# Patient Record
Sex: Female | Born: 1990 | Race: White | Hispanic: No | Marital: Married | Smoking: Former smoker
Health system: Southern US, Community
[De-identification: ages and names within clinical notes are randomized; demographics above are authoritative.]

## PROBLEM LIST (undated history)

## (undated) ENCOUNTER — Inpatient Hospital Stay (HOSPITAL_COMMUNITY): Payer: Self-pay

## (undated) DIAGNOSIS — Z8489 Family history of other specified conditions: Secondary | ICD-10-CM

## (undated) DIAGNOSIS — N941 Unspecified dyspareunia: Secondary | ICD-10-CM

## (undated) DIAGNOSIS — Z973 Presence of spectacles and contact lenses: Secondary | ICD-10-CM

## (undated) DIAGNOSIS — F909 Attention-deficit hyperactivity disorder, unspecified type: Secondary | ICD-10-CM

## (undated) DIAGNOSIS — F419 Anxiety disorder, unspecified: Secondary | ICD-10-CM

## (undated) HISTORY — PX: INTRAUTERINE DEVICE INSERTION: SHX323

## (undated) HISTORY — DX: Attention-deficit hyperactivity disorder, unspecified type: F90.9

## (undated) HISTORY — PX: INGUINAL HERNIA REPAIR: SUR1180

## (undated) HISTORY — PX: OTHER SURGICAL HISTORY: SHX169

## (undated) HISTORY — PX: TONSILLECTOMY AND ADENOIDECTOMY: SUR1326

---
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
## 2011-05-01 LAB — OB RESULTS CONSOLE ANTIBODY SCREEN: Antibody Screen: NEGATIVE

## 2011-05-01 LAB — OB RESULTS CONSOLE HEPATITIS B SURFACE ANTIGEN: Hepatitis B Surface Ag: NEGATIVE

## 2011-05-27 LAB — OB RESULTS CONSOLE GC/CHLAMYDIA
Chlamydia: NEGATIVE
Gonorrhea: NEGATIVE

## 2011-07-23 ENCOUNTER — Encounter: Payer: Self-pay | Admitting: Family Medicine

## 2011-07-23 ENCOUNTER — Ambulatory Visit (INDEPENDENT_AMBULATORY_CARE_PROVIDER_SITE_OTHER): Payer: Self-pay | Admitting: Family Medicine

## 2011-07-23 VITALS — BP 97/67 | HR 66 | Temp 97.1°F | Ht 62.5 in | Wt 114.0 lb

## 2011-07-23 DIAGNOSIS — Z3490 Encounter for supervision of normal pregnancy, unspecified, unspecified trimester: Secondary | ICD-10-CM

## 2011-07-23 DIAGNOSIS — Z331 Pregnant state, incidental: Secondary | ICD-10-CM

## 2011-07-23 DIAGNOSIS — L738 Other specified follicular disorders: Secondary | ICD-10-CM

## 2011-07-23 DIAGNOSIS — L739 Follicular disorder, unspecified: Secondary | ICD-10-CM

## 2011-07-23 MED ORDER — MUPIROCIN 2 % EX OINT: TOPICAL_OINTMENT | CUTANEOUS | Status: DC

## 2011-07-23 MED ORDER — CEPHALEXIN 500 MG PO CAPS: 500.0000 mg | ORAL_CAPSULE | Freq: Four times a day (QID) | ORAL | Status: AC

## 2011-07-23 NOTE — Patient Instructions (Addendum)
Staphylococcal Infections  Staphylococcus aureus (Staph) is a germ that may cause infections, especially on broken skin or wounds. Methicillin is a drug sometimes used to treat Staph infections. If the germ is resistant to methicillin, it is called MRSA. Methicillin and some other drugs may not work to treat the infection. However, there are other antibiotic drugs that may be used that will treat the infection. Your caregiver may do a culture from your wound, skin, or other site. This will tell him/her that you have MRSA present. Sometimes healthy people carry MRSA, but it may also cause an infection.  Staph infections, including MRSA can spread from one person to another by contact with an infected person. You may prevent spreading an MRSA infection to those you live with or others around you by following these steps:  Keep infections and pus or drainage material covered with clean dry bandages. Follow your caregiver's instructions on proper care of the wound. Pus from infected wounds can contain MRSA and spread the germ (bacteria) to others.   Advise your family and other close contacts to wash their hands frequently with soap and warm water. This should be done especially if they change your bandages or touch the infected wound or infectious materials.   Avoid sharing personal items (towels, washcloth, razor, clothing, or uniforms) that may have had contact with the infected wound.   Wash linens and clothes that become soiled, with hot water and laundry detergent. Drying clothes in a hot dryer, rather than air-drying, also helps kill bacteria in clothes.   Tell any healthcare providers who treat you that you have an MRSA infection. In the hospital steps will be taken to prevent the spread of MRSA.   Ask your caregiver about return to school or return to work if you have a Staph or MRSA infection.   If your caregiver has given you a follow-up appointment, it is very important to keep that  appointment. Not keeping the appointment could result in a chronic or permanent injury, pain, and disability. If there is any problem keeping the appointment, you must call back to this facility for assistance.  Staph and MRSA infections can become very serious and you should contact your caregiver if your infection gets worse. To fight the infection, follow your doctor's instructions for wound care and take all medicines as prescribed. SEEK MEDICAL CARE IF:   You have increased pus coming from the wound.   You have a fever.   You notice a bad smell coming from the wound or dressing.  SEEK IMMEDIATE MEDICAL CARE IF:  You have redness, red streaks, swelling, or increasing pain in the wound. Document Released: 04/04/2002 Document Revised: 01/01/2011 Document Reviewed: 08/29/2007 [facility] Patient Information 2012 [place], [place].  Eat yogurt and take a probiotic (such as Align) daily for next month

## 2011-07-23 NOTE — Assessment & Plan Note (Addendum)
Keflex 500 mg po qid x 10 days, Bactroban to b/l nares bid x 7 days, hot compresses and then peroxide to lesion tid and probiotics daily. Wound culture taken

## 2011-07-23 NOTE — Assessment & Plan Note (Signed)
Due in November 2013

## 2011-07-23 NOTE — Progress Notes (Signed)
Patient ID: Tonya Kramer, female   DOB: 09/25/1990, 21 y.o.   MRN: [id number] Tonya Kramer [id number] 1990/10/27 07/23/2011      Progress Note-Follow Up  Subjective  Chief Complaint  Chief Complaint  Patient presents with  . spot on nose    X 3-4 days, neck hurts    HPI  Patient is a 21 year old Caucasian female in today for evaluation of roughly 4 days of a painful lesion in her nares. She says it came up suddenly he is on the right anterior nasal septum. It is tender to the touch. She's been trying to debridement and drainage and then cleaned it with peroxide but today it is increasingly uncomfortable. No fevers or chills. No increased malaise and myalgias. She does have some pain and swelling in her right neck as well for the last one to 2 days. No recent upper respiratory infection, cough, sore throat, ear pain, headache, chest pain, palpitations or other concerns. She is pregnant and due this November.  Past Medical History  Diagnosis Date  . Hidradenitis 09/24/2010  . Tobacco abuse 09/24/2010  . ADHD (attention deficit hyperactivity disorder) 09/24/2010  . Folliculitis 09/24/2010    Past Surgical History  Procedure Date  . Double hernia  21 yrs old    operation  . Tonsillectomy 14    Family History  Problem Relation Age of Onset  . Hyperlipidemia Mother   . Fibromyalgia Mother   . Irritable bowel syndrome Mother   . Cancer Mother     skin cancer on face  . Arthritis Mother   . Tuberculosis Mother     carrier  . Hypertension Father   . Asthma Sister   . Cancer Brother     cancer on rib  . Other Brother     heart beat slow  . Arthritis Maternal Grandmother     hands and feet  . Hypertension Maternal Grandfather   . Heart disease Maternal Grandfather   . Cancer Maternal Grandfather     prostate  . COPD Maternal Grandfather     smoker  . Emphysema Maternal Grandfather     smoker  . Migraines Paternal Grandmother   . Depression Paternal Grandmother   .  Other Paternal Grandmother     hip replacement  . Arthritis Paternal Grandfather   . Drug abuse Paternal Grandfather   . Asthma Sister     History   Social History  . Marital Status: Single    Spouse Name: N/A    Number of Children: N/A  . Years of Education: N/A   Occupational History  . Not on file.   Social History Main Topics  . Smoking status: Former Smoker -- 1.0 packs/day for 3 years    Types: Cigarettes  . Smokeless tobacco: Never Used  . Alcohol Use: No  . Drug Use: No  . Sexually Active: Not Currently   Other Topics Concern  . Not on file   Social History Narrative  . No narrative on file    Current Outpatient Prescriptions on File Prior to Visit  Medication Sig Dispense Refill  . mupirocin (BACTROBAN) 2 % ointment Apply to affected area 2 times daily  22 g  1    No Known Allergies  Review of Systems  Review of Systems  Constitutional: Negative for fever, chills and malaise/fatigue.  HENT: Negative for nosebleeds and congestion.        Painful, red, swollen lesion at anterior right nares, sore neck and enlarged LN  on right as well  Eyes: Negative for discharge.  Respiratory: Negative for shortness of breath.   Cardiovascular: Negative for chest pain, palpitations and leg swelling.  Gastrointestinal: Negative for nausea, abdominal pain and diarrhea.  Genitourinary: Negative for dysuria.  Musculoskeletal: Negative for falls.  Skin: Negative for rash.  Neurological: Negative for loss of consciousness and headaches.  Endo/Heme/Allergies: Negative for polydipsia.  Psychiatric/Behavioral: Negative for depression and suicidal ideas. The patient is not nervous/anxious and does not have insomnia.     Objective  BP 97/67  Pulse 66  Temp 97.1 F (36.2 C) (Temporal)  Ht 5' 2.5" (1.588 m)  Wt 114 lb (51.71 kg)  BMI 20.52 kg/m2  SpO2 100%  LMP 09/22/2010  Physical Exam  Physical Exam  Constitutional: She is oriented to person, place, and time and  well-developed, well-nourished, and in no distress. No distress.  HENT:  Head: Normocephalic and atraumatic.       Right anterior nasal septum with red, swollen painful lesion  Eyes: Conjunctivae are normal.  Neck: Neck supple. No thyromegaly present.       Right anterior cervical LN enlarged and tender  Cardiovascular: Normal rate, regular rhythm and normal heart sounds.   No murmur heard. Pulmonary/Chest: Effort normal and breath sounds normal. She has no wheezes.  Abdominal: She exhibits no distension and no mass.  Musculoskeletal: She exhibits no edema.  Lymphadenopathy:    She has cervical adenopathy.  Neurological: She is alert and oriented to person, place, and time.  Skin: Skin is warm and dry. No rash noted. She is not diaphoretic.  Psychiatric: Memory, affect and judgment normal.    No results found for this basename: TSH   Lab Results  Component Value Date   WBC 22.3* 07/11/2009   HGB 10.6* 07/11/2009   HCT 30.7* 07/11/2009   MCV 93.1 07/11/2009   PLT 194 07/11/2009     Assessment & Plan  Folliculitis Keflex 500 mg po qid x 10 days, Bactroban to b/l nares bid x 7 days, hot compresses and then peroxide to lesion tid and probiotics daily. Wound culture taken  Pregnancy Due in November 2013

## 2011-07-25 LAB — WOUND CULTURE
Gram Stain: NONE SEEN
Gram Stain: NONE SEEN
Gram Stain: NONE SEEN

## 2011-10-04 ENCOUNTER — Emergency Department (HOSPITAL_BASED_OUTPATIENT_CLINIC_OR_DEPARTMENT_OTHER): Payer: Medicaid Other

## 2011-10-04 ENCOUNTER — Inpatient Hospital Stay (HOSPITAL_COMMUNITY)
Admission: AD | Admit: 2011-10-04 | Discharge: 2011-10-05 | Disposition: A | Discharge status: Home / Self Care | Payer: Medicaid Other | Source: Ambulatory Visit | Attending: Obstetrics and Gynecology | Admitting: Obstetrics and Gynecology

## 2011-10-04 ENCOUNTER — Encounter (HOSPITAL_BASED_OUTPATIENT_CLINIC_OR_DEPARTMENT_OTHER): Payer: Self-pay | Admitting: Emergency Medicine

## 2011-10-04 ENCOUNTER — Encounter (HOSPITAL_COMMUNITY): Payer: Self-pay | Admitting: *Deleted

## 2011-10-04 ENCOUNTER — Emergency Department (HOSPITAL_BASED_OUTPATIENT_CLINIC_OR_DEPARTMENT_OTHER)
Admission: EM | Admit: 2011-10-04 | Discharge: 2011-10-04 | Disposition: A | Discharge status: Home / Self Care | Payer: Medicaid Other | Source: Home / Self Care | Attending: Emergency Medicine | Admitting: Emergency Medicine

## 2011-10-04 DIAGNOSIS — Z3490 Encounter for supervision of normal pregnancy, unspecified, unspecified trimester: Secondary | ICD-10-CM

## 2011-10-04 DIAGNOSIS — O99891 Other specified diseases and conditions complicating pregnancy: Secondary | ICD-10-CM | POA: Insufficient documentation

## 2011-10-04 DIAGNOSIS — R0789 Other chest pain: Secondary | ICD-10-CM

## 2011-10-04 DIAGNOSIS — R071 Chest pain on breathing: Secondary | ICD-10-CM | POA: Insufficient documentation

## 2011-10-04 DIAGNOSIS — R0602 Shortness of breath: Secondary | ICD-10-CM | POA: Insufficient documentation

## 2011-10-04 LAB — CBC WITH DIFFERENTIAL/PLATELET
Basophils Absolute: 0 10*3/uL (ref 0.0–0.1)
Basophils Relative: 0 % (ref 0–1)
Eosinophils Absolute: 0 10*3/uL (ref 0.0–0.7)
Eosinophils Relative: 0 % (ref 0–5)
Hemoglobin: 11.4 g/dL — ABNORMAL LOW (ref 12.0–15.0)
Lymphocytes Relative: 10 % — ABNORMAL LOW (ref 12–46)
MCV: 90.4 fL (ref 78.0–100.0)
Monocytes Absolute: 0.6 10*3/uL (ref 0.1–1.0)
Neutro Abs: 6.9 10*3/uL (ref 1.7–7.7)
Neutrophils Relative %: 82 % — ABNORMAL HIGH (ref 43–77)
Platelets: 145 10*3/uL — ABNORMAL LOW (ref 150–400)
RDW: 11.9 % (ref 11.5–15.5)
WBC: 8.4 10*3/uL (ref 4.0–10.5)

## 2011-10-04 LAB — BASIC METABOLIC PANEL
BUN: 4 mg/dL — ABNORMAL LOW (ref 6–23)
CO2: 23 mEq/L (ref 19–32)
Calcium: 8.6 mg/dL (ref 8.4–10.5)
Chloride: 101 mEq/L (ref 96–112)
Glucose, Bld: 89 mg/dL (ref 70–99)
Potassium: 3.2 mEq/L — ABNORMAL LOW (ref 3.5–5.1)
Sodium: 136 mEq/L (ref 135–145)

## 2011-10-04 MED ORDER — FAMOTIDINE 20 MG PO TABS
20.0000 mg | ORAL_TABLET | Freq: Once | ORAL | Status: AC
  Administered 2011-10-04: 20 mg via ORAL
  Filled 2011-10-04: qty 1

## 2011-10-04 MED ORDER — POTASSIUM CHLORIDE CRYS ER 20 MEQ PO TBCR
40.0000 meq | EXTENDED_RELEASE_TABLET | Freq: Once | ORAL | Status: AC
  Administered 2011-10-04: 40 meq via ORAL
  Filled 2011-10-04: qty 2

## 2011-10-04 MED ORDER — CYCLOBENZAPRINE HCL 10 MG PO TABS
10.0000 mg | ORAL_TABLET | Freq: Once | ORAL | Status: AC
  Administered 2011-10-04: 10 mg via ORAL
  Filled 2011-10-04: qty 1

## 2011-10-04 MED ORDER — IOHEXOL 350 MG/ML SOLN
100.0000 mL | Freq: Once | INTRAVENOUS | Status: AC | PRN
  Administered 2011-10-04: 100 mL via INTRAVENOUS

## 2011-10-04 MED ORDER — SODIUM CHLORIDE 0.9 % IV BOLUS (SEPSIS)
1000.0000 mL | Freq: Once | INTRAVENOUS | Status: AC
  Administered 2011-10-04: 1000 mL via INTRAVENOUS

## 2011-10-04 MED ORDER — CYCLOBENZAPRINE HCL 10 MG PO TABS
10.0000 mg | ORAL_TABLET | Freq: Once | ORAL | Status: DC
  Filled 2011-10-04: qty 1

## 2011-10-04 MED ORDER — ACETAMINOPHEN 325 MG PO TABS
650.0000 mg | ORAL_TABLET | Freq: Once | ORAL | Status: AC
  Administered 2011-10-04: 650 mg via ORAL
  Filled 2011-10-04: qty 2

## 2011-10-04 MED ORDER — OXYCODONE-ACETAMINOPHEN 5-325 MG PO TABS
1.0000 | ORAL_TABLET | Freq: Once | ORAL | Status: AC
  Administered 2011-10-04: 1 via ORAL
  Filled 2011-10-04: qty 1

## 2011-10-04 NOTE — ED Notes (Signed)
History of blood clots: maternal grandfather had blood clots and paternal great grand mother had blood clots.

## 2011-10-04 NOTE — MAU Provider Note (Signed)
History     CSN: [id number]  Arrival date and time: 10/04/11 2043   None     Chief Complaint  Patient presents with  . Shortness of Breath   HPI Tonya Kramer is a 21 y.o. female @ [redacted]w[redacted]d gestation who presents to MAU with chest pain. She was evaluated at [facility] earlier today for same and had CT of chest, CXR and labs. Her K was low and she received IV fluids po potassium 40 meq. Patient report that the symptoms started 2 days ago. She has a cough in the mornings but usually goes away. The pain is located in the left chest wall with radiation to the left upper back. She rates the pain as 10/10. She describes the pain as sharp that is constant. The pan is worse with lying flat, take a deep breath and movement. Nothing makes the pain better. [facility] relief. The history was provided by the patient and her medical record.   OB History    Grav Para Term Preterm Abortions TAB SAB Ect Mult Living   2 1 1       1       Past Medical History  Diagnosis Date  . Hidradenitis 09/24/2010  . Tobacco abuse 09/24/2010  . ADHD (attention deficit hyperactivity disorder) 09/24/2010  . Folliculitis 09/24/2010  . Pregnancy 07/23/2011    Past Surgical History  Procedure Date  . Double hernia  21 yrs old    operation  . Tonsillectomy 14    Family History  Problem Relation Age of Onset  . Hyperlipidemia Mother   . Fibromyalgia Mother   . Irritable bowel syndrome Mother   . Cancer Mother     skin cancer on face  . Arthritis Mother   . Tuberculosis Mother     carrier  . Hypertension Father   . Asthma Sister   . Cancer Brother     cancer on rib  . Other Brother     heart beat slow  . Arthritis Maternal Grandmother     hands and feet  . Hypertension Maternal Grandfather   . Heart disease Maternal Grandfather   . Cancer Maternal Grandfather     prostate  . COPD Maternal Grandfather     smoker  . Emphysema Maternal Grandfather     smoker  .  Migraines Paternal Grandmother   . Depression Paternal Grandmother   . Other Paternal Grandmother     hip replacement  . Arthritis Paternal Grandfather   . Drug abuse Paternal Grandfather   . Asthma Sister     History  Substance Use Topics  . Smoking status: Former Smoker -- 1.0 packs/day for 3 years    Types: Cigarettes  . Smokeless tobacco: Never Used  . Alcohol Use: No    Allergies: No Known Allergies  Prescriptions prior to admission  Medication Sig Dispense Refill  . Prenatal Vit-Fe Fumarate-FA (PRENATAL MULTIVITAMIN) TABS Take 1 tablet by mouth every morning.         Review of Systems  Constitutional: Negative for fever, chills and weight loss.  HENT: Positive for sore throat. Negative for ear pain, nosebleeds, congestion and neck pain.   Eyes: Negative for blurred vision, double vision, photophobia and pain.  Respiratory: Positive for cough and shortness of breath. Negative for wheezing.   Cardiovascular: Positive for chest pain. Negative for palpitations and leg swelling.  Gastrointestinal: Positive for heartburn and nausea. Negative for vomiting, abdominal pain (  soreness upper abdomen), diarrhea and constipation.  Genitourinary: Negative for dysuria, urgency and frequency.  Musculoskeletal: Positive for back pain. Negative for myalgias.  Skin: Negative for itching and rash.  Neurological: Negative for dizziness, sensory change, speech change, seizures, weakness and headaches.  Endo/Heme/Allergies: Does not bruise/bleed easily.  Psychiatric/Behavioral: Negative for depression. The patient is not nervous/anxious and does not have insomnia.    Blood pressure 116/65, pulse 110, temperature 99 F (37.2 C), temperature source Oral, resp. rate 20, height 5\' 2"  (1.575 m), weight 124 lb (56.246 kg), last menstrual period 09/22/2010, SpO2 100.00%.  Physical Exam  Nursing note and vitals reviewed. Constitutional: She is oriented to person, place, and time. She appears  well-developed and well-nourished. No distress.  HENT:  Head: Normocephalic and atraumatic.  Eyes: EOM are normal.  Neck: Neck supple.  Cardiovascular:       Tachycardia   Respiratory: Effort normal. No respiratory distress. She has no wheezes. She has no rales. She exhibits tenderness.    GI: Soft. There is tenderness in the epigastric area. There is no rebound, no guarding and no CVA tenderness.  Genitourinary: Vagina normal.  Musculoskeletal: Normal range of motion.  Neurological: She is alert and oriented to person, place, and time.  Skin: Skin is warm and dry.  Psychiatric: She has a normal mood and affect. Her behavior is normal. Judgment and thought content normal.   Findings from visit to [facility] earlier today.  Dg Chest 2 View  10/04/2011  *RADIOLOGY REPORT*  Clinical Data: Shortness of breath  CHEST - 2 VIEW  Comparison: None.  Findings: The lungs are well-aerated and free from pulmonary edema, focal airspace consolidation or pulmonary nodule.  Cardiac and mediastinal contours are within normal limits.  No pneumothorax, or pleural effusion. No acute osseous findings.  IMPRESSION:  No acute cardiopulmonary disease.  Normal chest radiograph.   Original Report Authenticated By: Vilma Prader    Ct Angio Chest Pe W/cm &/or Wo Cm  10/04/2011  *RADIOLOGY REPORT*  Clinical Data: Left lower chest wall pain, worse with inspiration. The patient is pregnant, and abdomen was shielded.  CT ANGIOGRAPHY CHEST  Technique:  Multidetector CT imaging of the chest using the standard protocol during bolus administration of intravenous contrast. Multiplanar reconstructed images including MIPs were obtained and reviewed to evaluate the vascular anatomy.  Contrast: OMNIPAQUE IOHEXOL 350 MG/ML SOLN  Comparison: Chest radiograph 10/04/2011  Findings: This is a technically very good evaluation of the pulmonary arteries.  No focal filling defects are identified in the pulmonary arteries to suggest  pulmonary embolism. Normal heart size. Normal caliber and enhancement of the thoracic aorta. Negative for pleural or pericardial effusion.  Negative for lymphadenopathy in the chest.  The distal esophagus is slightly dilated with air and there is a small air-fluid level.  The lungs are well expanded and clear.  The very superior aspect of the lung apices are not included on this study.  No acute or suspicious bony abnormality.  IMPRESSION:  1.  Negative for pulmonary embolism.  This is a technically very good examination. 2.  The lungs are clear.  The extreme lung apices are excluded from the imaging field, better well visualized on the concurrent chest radiograph. 3.  Slight gaseous distention of the distal esophagus, containing an air-fluid level.  This can be seen in the setting of gastroesophageal reflux.   Original Report Authenticated By: Britta Mccreedy, M.D.      MAU Course: discussed with Dr. Rana Snare, will treat  pain and discharge patient home. If no pain relief will transfer to [facility].   Procedures Assessment: 21 y.o. female @ [redacted]w[redacted]d gestation with chest wall pain/muscle strain  Plan:  Percocet 5/325mg     Flexeril 10 mg po   GI Cocktail 30 ml.  @ 00:37 am pain has decreased from 10/10 to 3/10. Patient wants to go home and follow up in the office later today. If her symptoms return she is to go to [facility] for further evaluation. Medication List  As of 10/05/2011  3:47 AM   START taking these medications         oxyCODONE-acetaminophen 5-325 MG per tablet   Commonly known as: PERCOCET/ROXICET   Take 1 tablet by mouth every 4 (four) hours as needed for pain.         CONTINUE taking these medications         prenatal multivitamin Tabs          Where to get your medications    These are the prescriptions that you need to pick up.   You may get these medications from any pharmacy.         oxyCODONE-acetaminophen 5-325 MG per tablet          No Follow-up on file.  NEESE,HOPE, RN, FNP,  [facility] 10/05/2011, 12:32 AM

## 2011-10-04 NOTE — MAU Note (Signed)
Shortness of breath. Sharp pain during inhalation. Patient states the left of side under breast bone feels sore like someone has beaten on her. Went to [facility] today for the same thing. CT and chest xray were done. They said no blood clot and could be a pulled muscle. However it has gotten worst. Unable to lay flat.

## 2011-10-04 NOTE — ED Notes (Signed)
Shortness of breath x 2 days.  Some pain with deep breath on left.  No recent travel.  Some non-productive cough in morning with sore throat.  No respiratory distress noted.

## 2011-10-05 MED ORDER — OXYCODONE-ACETAMINOPHEN 5-325 MG PO TABS: 1.0000 | ORAL_TABLET | ORAL | Status: AC | PRN

## 2011-10-05 MED ORDER — GI COCKTAIL ~~LOC~~
30.0000 mL | Freq: Once | ORAL | Status: AC
  Administered 2011-10-05: 30 mL via ORAL
  Filled 2011-10-05: qty 30

## 2011-10-10 NOTE — ED Provider Notes (Signed)
History     CSN: [id number]  Arrival date & time 10/04/11  1308   First MD Initiated Contact with Patient 10/04/11 [phone]      Chief Complaint  Patient presents with  . Shortness of Breath    (Consider location/radiation/quality/duration/timing/severity/associated sxs/prior treatment) HPI Patient is a 21 yo G2P1 @ 31 weeks who presents today complaining of 2 days of severe left lower chest wall pain worse with taking a deep breath as well as shortness of breath.  She has taken no meds for this and denies cough or sick contacts.  She denies injury, abdominal pain, nausea, or vomiting.  This is different from prior pregnancies.  Patient denies personal history of thromboembolic disease but does have family history of thromboembolic disease. She denies recent long trips.  She is not tachycardic or tachypneic.  She denies shortness of breath.  She denies dyspnea on exertion. She denies vaginal bleeding, vaginal discharge, abdominal pain, nausea, vomiting, or change in baby's movements. There are no other associated or modifying factors.  Past Medical History  Diagnosis Date  . Hidradenitis 09/24/2010  . Tobacco abuse 09/24/2010  . ADHD (attention deficit hyperactivity disorder) 09/24/2010  . Folliculitis 09/24/2010  . Pregnancy 07/23/2011    Past Surgical History  Procedure Date  . Double hernia  21 yrs old    operation  . Tonsillectomy 14    Family History  Problem Relation Age of Onset  . Hyperlipidemia Mother   . Fibromyalgia Mother   . Irritable bowel syndrome Mother   . Cancer Mother     skin cancer on face  . Arthritis Mother   . Tuberculosis Mother     carrier  . Hypertension Father   . Asthma Sister   . Cancer Brother     cancer on rib  . Other Brother     heart beat slow  . Arthritis Maternal Grandmother     hands and feet  . Hypertension Maternal Grandfather   . Heart disease Maternal Grandfather   . Cancer Maternal Grandfather     prostate  . COPD Maternal  Grandfather     smoker  . Emphysema Maternal Grandfather     smoker  . Migraines Paternal Grandmother   . Depression Paternal Grandmother   . Other Paternal Grandmother     hip replacement  . Arthritis Paternal Grandfather   . Drug abuse Paternal Grandfather   . Asthma Sister     History  Substance Use Topics  . Smoking status: Former Smoker -- 1.0 packs/day for 3 years    Types: Cigarettes  . Smokeless tobacco: Never Used  . Alcohol Use: No    OB History    Grav Para Term Preterm Abortions TAB SAB Ect Mult Living   2 1 1       1       Review of Systems  Constitutional: Negative.   HENT: Negative.   Eyes: Negative.   Respiratory: Negative.   Cardiovascular: Positive for chest pain.  Gastrointestinal: Negative.   Genitourinary: Negative.   Musculoskeletal: Negative.   Skin: Negative.   Neurological: Negative.   Hematological: Negative.   Psychiatric/Behavioral: Negative.   All other systems reviewed and are negative.    Allergies  Review of patient's allergies indicates no known allergies.  Home Medications   Current Outpatient Rx  Name Route Sig Dispense Refill  . OXYCODONE-ACETAMINOPHEN 5-325 MG PO TABS Oral Take 1 tablet by mouth every 4 (four) hours as needed for pain. 6 tablet 0  .  PRENATAL MULTIVITAMIN CH Oral Take 1 tablet by mouth every morning.       BP 104/60  Pulse 97  Temp 97.7 F (36.5 C) (Oral)  Resp 18  SpO2 100%  LMP 09/22/2010  Physical Exam  Nursing note and vitals reviewed. GEN: Well-developed, well-nourished female in no distress HEENT: Atraumatic, normocephalic. Oropharynx clear without erythema EYES: PERRLA BL, no scleral icterus. NECK: Trachea midline, no meningismus CV: regular rate and rhythm. No murmurs, rubs, or gallops PULM: No respiratory distress.  No crackles, wheezes, or rales. GI: gravid abdomen c/w dates. No guarding, rebound, or tenderness. + bowel sounds  GU: deferred, FHT 144 Neuro: cranial nerves grossly 2-12  intact, no abnormalities of strength or sensation, A and O x 3 MSK: Patient moves all 4 extremities symmetrically, no deformity, edema, or injury noted Skin: No rashes petechiae, purpura, or jaundice Psych: no abnormality of mood   ED Course  Procedures (including critical care time)  Labs Reviewed  CBC WITH DIFFERENTIAL - Abnormal; Notable for the following:    RBC 3.54 (*)     Hemoglobin 11.4 (*)     HCT 32.0 (*)     Platelets 145 (*)     Neutrophils Relative 82 (*)     Lymphocytes Relative 10 (*)     All other components within normal limits  BASIC METABOLIC PANEL - Abnormal; Notable for the following:    Potassium 3.2 (*)     BUN 4 (*)     All other components within normal limits   No results found.   1. Left-sided chest wall pain       MDM  Patient presented with pain over the left lowermost ribs with shortness of breath and worsening with inspiration.  Patient denied cough or fevers to suggest PNA or other infectious process.  Patient had CBC with anemia consistent with gestational status, renal with slight hypokalemia, and CXR with no pulmonary process seen and no cardiomegaly.  Patient was treated with tylenol with no improvement.  We discussed the risks and benefits of radiation in pregnancy and together decided that further imaging to eval for PE given her presentation was necessary.  I discussed the case with radiologist , Dr. Earley Abide who felt CTPA was still the best diagnostic test based on risk/benefit ratio in pregnancy.  Patient received IVF and had CT angio of chest given unreliability of d-dimer in pregnancy.  This showed no sign of embolus.  Patient had no belly TTP and she remained HDS.  No definite cause was found but patient was feeling better and did not have signs of PNA, heart failure, PE, or other emergent causes of CP.  Patient was d/c'ed with instructions to continue tylenol and follow-up with her OBGYN.         Cyndra Numbers, MD 10/10/11 1214

## 2011-12-01 ENCOUNTER — Encounter (HOSPITAL_COMMUNITY): Payer: Self-pay | Admitting: *Deleted

## 2011-12-01 ENCOUNTER — Inpatient Hospital Stay (HOSPITAL_COMMUNITY)
Admission: AD | Admit: 2011-12-01 | Discharge: 2011-12-01 | Disposition: A | Discharge status: Home / Self Care | Payer: Medicaid Other | Source: Ambulatory Visit | Attending: Obstetrics and Gynecology | Admitting: Obstetrics and Gynecology

## 2011-12-01 DIAGNOSIS — O479 False labor, unspecified: Secondary | ICD-10-CM | POA: Insufficient documentation

## 2011-12-01 NOTE — MAU Note (Signed)
Pt up to walk.

## 2011-12-01 NOTE — MAU Note (Signed)
Dr. Vincente Poli notified of SVE after wallking.  Pt may walk another hour and recheck or D/C home and follow up in the office later today.

## 2011-12-01 NOTE — MAU Note (Signed)
Plan of care discussed with pt.  Pt desires to go home and follow up in the office later today.

## 2011-12-01 NOTE — MAU Note (Signed)
Dr. Adalberto Ill notified of pt.  Pt to walk x 1 hr and recheck cervix.

## 2011-12-01 NOTE — MAU Note (Signed)
Contractions, 4 cm at office yesterday

## 2011-12-02 ENCOUNTER — Encounter (HOSPITAL_COMMUNITY): Payer: Self-pay | Admitting: *Deleted

## 2011-12-02 ENCOUNTER — Inpatient Hospital Stay (HOSPITAL_COMMUNITY): Payer: Medicaid Other | Admitting: Anesthesiology

## 2011-12-02 ENCOUNTER — Inpatient Hospital Stay (HOSPITAL_COMMUNITY)
Admission: AD | Admit: 2011-12-02 | Discharge: 2011-12-04 | DRG: 775 | Disposition: A | Discharge status: Home / Self Care | Payer: Medicaid Other | Source: Ambulatory Visit | Attending: Obstetrics and Gynecology | Admitting: Obstetrics and Gynecology

## 2011-12-02 ENCOUNTER — Encounter (HOSPITAL_COMMUNITY): Payer: Self-pay | Admitting: Anesthesiology

## 2011-12-02 DIAGNOSIS — Z3490 Encounter for supervision of normal pregnancy, unspecified, unspecified trimester: Secondary | ICD-10-CM

## 2011-12-02 DIAGNOSIS — Z720 Tobacco use: Secondary | ICD-10-CM

## 2011-12-02 DIAGNOSIS — L739 Follicular disorder, unspecified: Secondary | ICD-10-CM

## 2011-12-02 DIAGNOSIS — F909 Attention-deficit hyperactivity disorder, unspecified type: Secondary | ICD-10-CM

## 2011-12-02 LAB — CBC
Hemoglobin: 11.3 g/dL — ABNORMAL LOW (ref 12.0–15.0)
RBC: 3.78 MIL/uL — ABNORMAL LOW (ref 3.87–5.11)

## 2011-12-02 LAB — POCT FERN TEST: POCT Fern Test: POSITIVE

## 2011-12-02 LAB — RPR: RPR Ser Ql: NONREACTIVE

## 2011-12-02 MED ORDER — FENTANYL 2.5 MCG/ML BUPIVACAINE 1/10 % EPIDURAL INFUSION (WH - ANES)
14.0000 mL/h | INTRAMUSCULAR | Status: DC
  Filled 2011-12-02: qty 125

## 2011-12-02 MED ORDER — BUTORPHANOL TARTRATE 1 MG/ML IJ SOLN
1.0000 mg | INTRAMUSCULAR | Status: DC | PRN
  Administered 2011-12-02: 1 mg via INTRAVENOUS
  Filled 2011-12-02: qty 1

## 2011-12-02 MED ORDER — OXYCODONE-ACETAMINOPHEN 5-325 MG PO TABS
1.0000 | ORAL_TABLET | ORAL | Status: DC | PRN
Start: 2011-12-02 — End: 2011-12-04
  Administered 2011-12-02 – 2011-12-04 (×8): 1 via ORAL
  Filled 2011-12-02 (×8): qty 1

## 2011-12-02 MED ORDER — LACTATED RINGERS IV SOLN: 500.0000 mL | INTRAVENOUS | Status: DC | PRN

## 2011-12-02 MED ORDER — ACETAMINOPHEN 325 MG PO TABS: 650.0000 mg | ORAL_TABLET | ORAL | Status: DC | PRN

## 2011-12-02 MED ORDER — ONDANSETRON HCL 4 MG/2ML IJ SOLN
4.0000 mg | Freq: Four times a day (QID) | INTRAMUSCULAR | Status: DC | PRN
Start: 2011-12-02 — End: 2011-12-02

## 2011-12-02 MED ORDER — PHENYLEPHRINE 40 MCG/ML (10ML) SYRINGE FOR IV PUSH (FOR BLOOD PRESSURE SUPPORT)
80.0000 ug | PREFILLED_SYRINGE | INTRAVENOUS | Status: DC | PRN
  Filled 2011-12-02: qty 5

## 2011-12-02 MED ORDER — PRENATAL MULTIVITAMIN CH
1.0000 | ORAL_TABLET | Freq: Every day | ORAL | Status: DC
  Administered 2011-12-02 – 2011-12-04 (×3): 1 via ORAL
  Filled 2011-12-02 (×3): qty 1

## 2011-12-02 MED ORDER — DIPHENHYDRAMINE HCL 50 MG/ML IJ SOLN: 12.5000 mg | INTRAMUSCULAR | Status: DC | PRN

## 2011-12-02 MED ORDER — TETANUS-DIPHTH-ACELL PERTUSSIS 5-2.5-18.5 LF-MCG/0.5 IM SUSP: 0.5000 mL | Freq: Once | INTRAMUSCULAR | Status: DC

## 2011-12-02 MED ORDER — ONDANSETRON HCL 4 MG/2ML IJ SOLN: 4.0000 mg | INTRAMUSCULAR | Status: DC | PRN

## 2011-12-02 MED ORDER — OXYCODONE-ACETAMINOPHEN 5-325 MG PO TABS: 1.0000 | ORAL_TABLET | ORAL | Status: DC | PRN

## 2011-12-02 MED ORDER — SENNOSIDES-DOCUSATE SODIUM 8.6-50 MG PO TABS
2.0000 | ORAL_TABLET | Freq: Every day | ORAL | Status: DC
  Administered 2011-12-02 – 2011-12-03 (×2): 2 via ORAL

## 2011-12-02 MED ORDER — ONDANSETRON HCL 4 MG PO TABS: 4.0000 mg | ORAL_TABLET | ORAL | Status: DC | PRN

## 2011-12-02 MED ORDER — LIDOCAINE HCL (PF) 1 % IJ SOLN
INTRAMUSCULAR | Status: DC | PRN
  Administered 2011-12-02 (×2): 4 mL

## 2011-12-02 MED ORDER — OXYTOCIN 40 UNITS IN LACTATED RINGERS INFUSION - SIMPLE MED
62.5000 mL/h | INTRAVENOUS | Status: DC
  Administered 2011-12-02: 62.5 mL/h via INTRAVENOUS
  Filled 2011-12-02: qty 1000

## 2011-12-02 MED ORDER — MEDROXYPROGESTERONE ACETATE 150 MG/ML IM SUSP: 150.0000 mg | INTRAMUSCULAR | Status: DC | PRN

## 2011-12-02 MED ORDER — DIBUCAINE 1 % RE OINT: 1.0000 "application " | TOPICAL_OINTMENT | RECTAL | Status: DC | PRN

## 2011-12-02 MED ORDER — DIPHENHYDRAMINE HCL 25 MG PO CAPS
25.0000 mg | ORAL_CAPSULE | Freq: Four times a day (QID) | ORAL | Status: DC | PRN
  Administered 2011-12-02: 25 mg via ORAL
  Filled 2011-12-02: qty 1

## 2011-12-02 MED ORDER — LANOLIN HYDROUS EX OINT: TOPICAL_OINTMENT | CUTANEOUS | Status: DC | PRN

## 2011-12-02 MED ORDER — WITCH HAZEL-GLYCERIN EX PADS: 1.0000 "application " | MEDICATED_PAD | CUTANEOUS | Status: DC | PRN

## 2011-12-02 MED ORDER — LACTATED RINGERS IV SOLN: 500.0000 mL | Freq: Once | INTRAVENOUS | Status: DC

## 2011-12-02 MED ORDER — SIMETHICONE 80 MG PO CHEW: 80.0000 mg | CHEWABLE_TABLET | ORAL | Status: DC | PRN

## 2011-12-02 MED ORDER — LIDOCAINE HCL (PF) 1 % IJ SOLN
30.0000 mL | INTRAMUSCULAR | Status: DC | PRN
  Filled 2011-12-02: qty 30

## 2011-12-02 MED ORDER — CITRIC ACID-SODIUM CITRATE 334-500 MG/5ML PO SOLN: 30.0000 mL | ORAL | Status: DC | PRN

## 2011-12-02 MED ORDER — IBUPROFEN 600 MG PO TABS
600.0000 mg | ORAL_TABLET | Freq: Four times a day (QID) | ORAL | Status: DC
  Administered 2011-12-02 – 2011-12-04 (×8): 600 mg via ORAL
  Filled 2011-12-02 (×8): qty 1

## 2011-12-02 MED ORDER — EPHEDRINE 5 MG/ML INJ: 10.0000 mg | INTRAVENOUS | Status: DC | PRN

## 2011-12-02 MED ORDER — PHENYLEPHRINE 40 MCG/ML (10ML) SYRINGE FOR IV PUSH (FOR BLOOD PRESSURE SUPPORT): 80.0000 ug | PREFILLED_SYRINGE | INTRAVENOUS | Status: DC | PRN

## 2011-12-02 MED ORDER — PROMETHAZINE HCL 25 MG/ML IJ SOLN
12.5000 mg | Freq: Four times a day (QID) | INTRAMUSCULAR | Status: DC | PRN
  Administered 2011-12-02: 12.5 mg via INTRAVENOUS
  Filled 2011-12-02: qty 1

## 2011-12-02 MED ORDER — LACTATED RINGERS IV SOLN
INTRAVENOUS | Status: DC
  Administered 2011-12-02 (×2): via INTRAVENOUS

## 2011-12-02 MED ORDER — OXYTOCIN BOLUS FROM INFUSION: 500.0000 mL | INTRAVENOUS | Status: DC

## 2011-12-02 MED ORDER — FENTANYL 2.5 MCG/ML BUPIVACAINE 1/10 % EPIDURAL INFUSION (WH - ANES)
INTRAMUSCULAR | Status: DC | PRN
  Administered 2011-12-02: 14 mL/h via EPIDURAL

## 2011-12-02 MED ORDER — MEASLES, MUMPS & RUBELLA VAC ~~LOC~~ INJ
0.5000 mL | INJECTION | Freq: Once | SUBCUTANEOUS | Status: DC
  Filled 2011-12-02: qty 0.5

## 2011-12-02 MED ORDER — BENZOCAINE-MENTHOL 20-0.5 % EX AERO: 1.0000 "application " | INHALATION_SPRAY | CUTANEOUS | Status: DC | PRN

## 2011-12-02 MED ORDER — IBUPROFEN 600 MG PO TABS: 600.0000 mg | ORAL_TABLET | Freq: Four times a day (QID) | ORAL | Status: DC | PRN

## 2011-12-02 MED ORDER — EPHEDRINE 5 MG/ML INJ
10.0000 mg | INTRAVENOUS | Status: DC | PRN
  Filled 2011-12-02: qty 4

## 2011-12-02 NOTE — Progress Notes (Signed)
SVD of vigerous female infant w/ apgars of 9,9.  Placenta delivered spontaneous w/ 3VC.   No lacerations.  Fundus firm.  EBL 350cc .  Mom & baby stable in LDR

## 2011-12-02 NOTE — MAU Note (Signed)
Pt states her water broke at 0030-clear fluid

## 2011-12-02 NOTE — Anesthesia Procedure Notes (Signed)
Epidural Patient location during procedure: OB Start time: 12/02/2011 4:09 AM  Staffing Anesthesiologist: Malen Gauze, Brasen Bundren A. Performed by: anesthesiologist   Preanesthetic Checklist Completed: patient identified, site marked, surgical consent, pre-op evaluation, timeout performed, IV checked, risks and benefits discussed and monitors and equipment checked  Epidural Patient position: sitting Prep: site prepped and draped and DuraPrep Patient monitoring: continuous pulse ox and blood pressure Approach: midline Injection technique: LOR air  Needle:  Needle type: Tuohy  Needle gauge: 17 G Needle length: 9 cm and 9 Needle insertion depth: 4 cm Catheter type: closed end flexible Catheter size: 19 Gauge Catheter at skin depth: 9 cm Test dose: negative and Other  Assessment Events: blood not aspirated, injection not painful, no injection resistance, negative IV test and no paresthesia  Additional Notes Patient identified. Risks and benefits discussed including failed block, incomplete  Pain control, post dural puncture headache, nerve damage, paralysis, blood pressure Changes, nausea, vomiting, reactions to medications-both toxic and allergic and post Partum back pain. All questions were answered. Patient expressed understanding and wished to proceed. Sterile technique was used throughout procedure. Epidural site was Dressed with sterile barrier dressing. No paresthesias, signs of intravascular injection Or signs of intrathecal spread were encountered.  Patient was more comfortable after the epidural was dosed. Please see RN's note for documentation of vital signs and FHR which are stable.

## 2011-12-02 NOTE — Anesthesia Preprocedure Evaluation (Signed)
Anesthesia Evaluation  Patient identified by MRN, date of birth, ID band Patient awake    Reviewed: Allergy & Precautions, H&P , Patient's Chart, lab work & pertinent test results  Airway Mallampati: III TM Distance: >3 FB Neck ROM: full    Dental No notable dental hx. (+) Teeth Intact   Pulmonary neg pulmonary ROS, former smoker,  breath sounds clear to auscultation  Pulmonary exam normal       Cardiovascular negative cardio ROS  Rhythm:regular Rate:Normal     Neuro/Psych PSYCHIATRIC DISORDERS ADHDnegative neurological ROS     GI/Hepatic negative GI ROS, Neg liver ROS,   Endo/Other  negative endocrine ROS  Renal/GU negative Renal ROS  negative genitourinary   Musculoskeletal   Abdominal Normal abdominal exam  (+)   Peds  Hematology negative hematology ROS (+)   Anesthesia Other Findings   Reproductive/Obstetrics (+) Pregnancy                           Anesthesia Physical Anesthesia Plan  ASA: II  Anesthesia Plan: Epidural   Post-op Pain Management:    Induction:   Airway Management Planned:   Additional Equipment:   Intra-op Plan:   Post-operative Plan:   Informed Consent: I have reviewed the patients History and Physical, chart, labs and discussed the procedure including the risks, benefits and alternatives for the proposed anesthesia with the patient or authorized representative who has indicated his/her understanding and acceptance.     Plan Discussed with: Anesthesiologist  Anesthesia Plan Comments:         Anesthesia Quick Evaluation

## 2011-12-02 NOTE — H&P (Signed)
21 yo G2P1 @ 38+6 wks presents w/ SROM & labor.  Fluid clear.  No VB.  + FM  Past History - see [facility]ollister.  GBS neg  AF, VSS Gen - uncomfortable w/ ctx Abd - gravid, NT  EFW 7# Ext - no edema Cvx 4cm on admit  A/P:  Admit Epidural prn Exp mngt

## 2011-12-02 NOTE — Anesthesia Postprocedure Evaluation (Signed)
  Anesthesia Post-op Note  Patient: Tonya Kramer  Procedure(s) Performed: * No procedures listed *  Patient Location: Mother/Baby  Anesthesia Type:Epidural  Level of Consciousness: awake, alert  and oriented  Airway and Oxygen Therapy: Patient Spontanous Breathing  Post-op Pain: none  Post-op Assessment: Post-op Vital signs reviewed and Patient's Cardiovascular Status Stable  Post-op Vital Signs: Reviewed and stable  Complications: No apparent anesthesia complications

## 2011-12-03 LAB — CBC
Hemoglobin: 9.8 g/dL — ABNORMAL LOW (ref 12.0–15.0)
MCV: 88.2 fL (ref 78.0–100.0)
Platelets: 146 10*3/uL — ABNORMAL LOW (ref 150–400)
RBC: 3.38 MIL/uL — ABNORMAL LOW (ref 3.87–5.11)
WBC: 12.7 10*3/uL — ABNORMAL HIGH (ref 4.0–10.5)

## 2011-12-03 MED ORDER — MENTHOL 3 MG MT LOZG
1.0000 | LOZENGE | OROMUCOSAL | Status: DC | PRN
  Administered 2011-12-03 – 2011-12-04 (×4): 3 mg via ORAL
  Filled 2011-12-03 (×4): qty 9

## 2011-12-03 NOTE — Progress Notes (Signed)
Post Partum Day 1 Subjective: no complaints, up ad lib, voiding and tolerating PO  Objective: Blood pressure 100/66, pulse 79, temperature 97.2 F (36.2 C), temperature source Oral, resp. rate 18, height 5\' 3"  (1.6 m), weight 58.06 kg (128 lb), last menstrual period 09/22/2010, SpO2 97.00%, unknown if currently breastfeeding.  Physical Exam:  General: alert and cooperative Lochia: appropriate Uterine Fundus: firm Incision: perineum intact DVT Evaluation: No evidence of DVT seen on physical exam. No significant calf/ankle edema.   Basename 12/03/11 0515 12/02/11 0240  HGB 9.8* 11.3*  HCT 29.8* 33.1*    Assessment/Plan: Plan for discharge tomorrow   LOS: 1 day   Thea Holshouser G 12/03/2011, 7:56 AM

## 2011-12-03 NOTE — Progress Notes (Signed)
UR chart review completed.  

## 2011-12-04 MED ORDER — PRENATAL MULTIVITAMIN CH: 1.0000 | ORAL_TABLET | Freq: Every day | ORAL | Status: DC

## 2011-12-04 MED ORDER — OXYCODONE-ACETAMINOPHEN 5-325 MG PO TABS: 1.0000 | ORAL_TABLET | ORAL | Status: DC | PRN

## 2011-12-04 MED ORDER — IBUPROFEN 600 MG PO TABS: 600.0000 mg | ORAL_TABLET | Freq: Four times a day (QID) | ORAL | Status: DC

## 2011-12-04 NOTE — Discharge Summary (Signed)
Obstetric Discharge Summary Reason for Admission: rupture of membranes Prenatal Procedures: ultrasound Intrapartum Procedures: spontaneous vaginal delivery Postpartum Procedures: none Complications-Operative and Postpartum: none Hemoglobin  Date Value Range Status  12/03/2011 9.8* 12.0 - 15.0 g/dL Final     HCT  Date Value Range Status  12/03/2011 29.8* 36.0 - 46.0 % Final    Physical Exam:  General: alert and cooperative Lochia: appropriate Uterine Fundus: firm Incision: perineum intact DVT Evaluation: No evidence of DVT seen on physical exam. Negative Homan's sign. No cords or calf tenderness. No significant calf/ankle edema.  Discharge Diagnoses: Term Pregnancy-delivered  Discharge Information: Date: 12/04/2011 Activity: pelvic rest Diet: routine Medications: PNV, Ibuprofen and Percocet Condition: stable Instructions: refer to practice specific booklet Discharge to: home   Newborn Data: Live born female  Birth Weight: 6 lb 13.8 oz (3113 g) APGAR: 9, 9  Home with mother.  Elizeo Rodriques G 12/04/2011, 7:59 AM

## 2012-02-08 ENCOUNTER — Encounter: Payer: Self-pay | Admitting: Family Medicine

## 2012-02-08 ENCOUNTER — Ambulatory Visit (INDEPENDENT_AMBULATORY_CARE_PROVIDER_SITE_OTHER): Payer: Medicaid Other | Admitting: Family Medicine

## 2012-02-08 VITALS — BP 90/70 | HR 70 | Temp 97.4°F | Ht 63.0 in | Wt 116.0 lb

## 2012-02-08 DIAGNOSIS — I959 Hypotension, unspecified: Secondary | ICD-10-CM

## 2012-02-08 DIAGNOSIS — R319 Hematuria, unspecified: Secondary | ICD-10-CM

## 2012-02-08 DIAGNOSIS — R300 Dysuria: Secondary | ICD-10-CM | POA: Insufficient documentation

## 2012-02-08 DIAGNOSIS — R309 Painful micturition, unspecified: Secondary | ICD-10-CM

## 2012-02-08 LAB — RENAL FUNCTION PANEL
Calcium: 9 mg/dL (ref 8.4–10.5)
Phosphorus: 4.1 mg/dL (ref 2.3–4.6)
Potassium: 3.7 mEq/L (ref 3.5–5.3)
Sodium: 139 mEq/L (ref 135–145)

## 2012-02-08 LAB — TSH: TSH: 0.538 u[IU]/mL (ref 0.350–4.500)

## 2012-02-08 LAB — CBC
Platelets: 211 10*3/uL (ref 150–400)
RBC: 4.27 MIL/uL (ref 3.87–5.11)
WBC: 6.7 10*3/uL (ref 4.0–10.5)

## 2012-02-08 LAB — POCT URINALYSIS DIPSTICK
Glucose, UA: NEGATIVE
Ketones, UA: NEGATIVE
Spec Grav, UA: >=1.03

## 2012-02-08 MED ORDER — CIPROFLOXACIN HCL 500 MG PO TABS: 500.0000 mg | ORAL_TABLET | Freq: Two times a day (BID) | ORAL | Status: DC

## 2012-02-08 MED ORDER — PHENAZOPYRIDINE HCL 200 MG PO TABS: 200.0000 mg | ORAL_TABLET | Freq: Three times a day (TID) | ORAL | Status: DC | PRN

## 2012-02-08 NOTE — Progress Notes (Signed)
Patient ID: Tonya Kramer, female   DOB: [birth year], 22 y.o.   MRN: [id number] AIMY SWEETING [id number] [birth year] 02/08/2012      Progress Note-Follow Up  Subjective  Chief Complaint  Chief Complaint  Patient presents with  . Urinary Tract Infection    X 3 days    HPI  Patient is a 22 year old Caucasian female who is in today for evaluation of dysuria. She notes for the last 3 days she's had urinary frequency, urgency and some dysuria. Denies hematuria, fevers, chills, abdominal or back pain. Denies nausea, diarrhea, anorexia, malaise or myalgias. Does report being sexually active. Has not tried any medications thus far has been trying to hydrate better but dysuria is persistent. She noted before this started she was drinking more caffeine and less water than normal. She also has a 27-month-old son and notes getting very poor sleep and also hydrating poorly. She again is drinking significant amounts of caffeine and very little water. No chest pain, palpitations recent illness, fevers, shortness of breath are noted today  Past Medical History  Diagnosis Date  . Hidradenitis 09/24/2010  . Tobacco abuse 09/24/2010  . ADHD (attention deficit hyperactivity disorder) 09/24/2010  . Folliculitis 09/24/2010  . Pregnancy 07/23/2011  . Dysuria 02/08/2012  . Hypotension 02/08/2012    Past Surgical History  Procedure Date  . Double hernia  22 yrs old    operation  . Tonsillectomy 14    Family History  Problem Relation Age of Onset  . Hyperlipidemia Mother   . Fibromyalgia Mother   . Irritable bowel syndrome Mother   . Cancer Mother     skin cancer on face  . Arthritis Mother   . Tuberculosis Mother     carrier  . Hypertension Father   . Asthma Sister   . Cancer Brother     cancer on rib  . Other Brother     heart beat slow  . Arthritis Maternal Grandmother     hands and feet  . Hypertension Maternal Grandfather   . Heart disease Maternal Grandfather   . Cancer Maternal Grandfather       prostate  . COPD Maternal Grandfather     smoker  . Emphysema Maternal Grandfather     smoker  . Migraines Paternal Grandmother   . Depression Paternal Grandmother   . Other Paternal Grandmother     hip replacement  . Arthritis Paternal Grandfather   . Drug abuse Paternal Grandfather   . Asthma Sister     History   Social History  . Marital Status: Married    Spouse Name: N/A    Number of Children: N/A  . Years of Education: N/A   Occupational History  . Not on file.   Social History Main Topics  . Smoking status: Former Smoker -- 1.0 packs/day for 3 years    Types: Cigarettes  . Smokeless tobacco: Never Used  . Alcohol Use: No  . Drug Use: No  . Sexually Active: Not Currently   Other Topics Concern  . Not on file   Social History Narrative  . No narrative on file    No current outpatient prescriptions on file prior to visit.    No Known Allergies  Review of Systems  Review of Systems  Constitutional: Negative for fever and malaise/fatigue.  HENT: Negative for congestion.   Eyes: Negative for discharge.  Respiratory: Negative for shortness of breath.   Cardiovascular: Negative for chest pain, palpitations and leg swelling.  Gastrointestinal:  Negative for nausea, vomiting, abdominal pain, diarrhea and constipation.  Genitourinary: Positive for dysuria, urgency and frequency. Negative for hematuria and flank pain.  Musculoskeletal: Negative for falls.  Skin: Negative for rash.  Neurological: Positive for dizziness. Negative for loss of consciousness and headaches.  Endo/Heme/Allergies: Negative for polydipsia.  Psychiatric/Behavioral: Negative for depression and suicidal ideas. The patient is not nervous/anxious and does not have insomnia.     Objective  BP 90/70  Pulse 70  Temp 97.4 F (36.3 C) (Temporal)  Ht 5\' 3"  (1.6 m)  Wt 116 lb (52.617 kg)  BMI 20.55 kg/m2  SpO2 100%  Breastfeeding? No  Physical Exam  Physical Exam  Constitutional:  She is oriented to person, place, and time and well-developed, well-nourished, and in no distress. No distress.  HENT:  Head: Normocephalic and atraumatic.  Eyes: Conjunctivae normal are normal.  Neck: Neck supple. No thyromegaly present.  Cardiovascular: Normal rate, regular rhythm and normal heart sounds.   No murmur heard. Pulmonary/Chest: Effort normal and breath sounds normal. She has no wheezes.  Abdominal: She exhibits no distension and no mass.  Musculoskeletal: She exhibits no edema.  Lymphadenopathy:    She has no cervical adenopathy.  Neurological: She is alert and oriented to person, place, and time.  Skin: Skin is warm and dry. No rash noted. She is not diaphoretic.  Psychiatric: Memory, affect and judgment normal.    No results found for this basename: TSH   Lab Results  Component Value Date   WBC 12.7* 12/03/2011   HGB 9.8* 12/03/2011   HCT 29.8* 12/03/2011   MCV 88.2 12/03/2011   PLT 146* 12/03/2011   Lab Results  Component Value Date   CREATININE 0.60 10/04/2011   BUN 4* 10/04/2011   NA 136 10/04/2011   K 3.2* 10/04/2011   CL 101 10/04/2011   CO2 23 10/04/2011     Assessment & Plan  Dysuria Started on Ciprofloxacin 500 mg bid, Pyridium prn, increase rest and fluids. Urine sent for culture  Hypotension Likely POTS and noted to be mildly dehydrated today. encourgaed increased hydration, and caffeine and add a Gatorade daily. Check a CBC, renal, TSH and have her return in 3 months time or as needed for further evaluation

## 2012-02-08 NOTE — Patient Instructions (Addendum)
Start a probiotic daily for the next month to help grow the healthy bacteria that was killed off while taking the antibiotics, such as Align, Digestive Advantage or a generic equivalent.  Urinary Tract Infection Urinary tract infections (UTIs) can develop anywhere along your urinary tract. Your urinary tract is your body's drainage system for removing wastes and extra water. Your urinary tract includes two kidneys, two ureters, a bladder, and a urethra. Your kidneys are a pair of bean-shaped organs. Each kidney is about the size of your fist. They are located below your ribs, one on each side of your spine. CAUSES Infections are caused by microbes, which are microscopic organisms, including fungi, viruses, and bacteria. These organisms are so small that they can only be seen through a microscope. Bacteria are the microbes that most commonly cause UTIs. SYMPTOMS  Symptoms of UTIs may vary by age and gender of the patient and by the location of the infection. Symptoms in young women typically include a frequent and intense urge to urinate and a painful, burning feeling in the bladder or urethra during urination. Older women and men are more likely to be tired, shaky, and weak and have muscle aches and abdominal pain. A fever may mean the infection is in your kidneys. Other symptoms of a kidney infection include pain in your back or sides below the ribs, nausea, and vomiting. DIAGNOSIS To diagnose a UTI, your caregiver will ask you about your symptoms. Your caregiver also will ask to provide a urine sample. The urine sample will be tested for bacteria and white blood cells. White blood cells are made by your body to help fight infection. TREATMENT  Typically, UTIs can be treated with medication. Because most UTIs are caused by a bacterial infection, they usually can be treated with the use of antibiotics. The choice of antibiotic and length of treatment depend on your symptoms and the type of bacteria  causing your infection. HOME CARE INSTRUCTIONS  If you were prescribed antibiotics, take them exactly as your caregiver instructs you. Finish the medication even if you feel better after you have only taken some of the medication.  Drink enough water and fluids to keep your urine clear or pale yellow.  Avoid caffeine, tea, and carbonated beverages. They tend to irritate your bladder.  Empty your bladder often. Avoid holding urine for long periods of time.  Empty your bladder before and after sexual intercourse.  After a bowel movement, women should cleanse from front to back. Use each tissue only once. SEEK MEDICAL CARE IF:   You have back pain.  You develop a fever.  Your symptoms do not begin to resolve within 3 days. SEEK IMMEDIATE MEDICAL CARE IF:   You have severe back pain or lower abdominal pain.  You develop chills.  You have nausea or vomiting.  You have continued burning or discomfort with urination. MAKE SURE YOU:   Understand these instructions.  Will watch your condition.  Will get help right away if you are not doing well or get worse. Document Released: 10/22/2004 Document Revised: 07/14/2011 Document Reviewed: 02/20/2011 [facility] Patient Information 2013 [place], [place]. Postural Orthostatic Tachycardia Syndrome Postural orthostatic tachycardia syndrome (POTS) is an increased heart rate when going from a lying (supine) position to a standing position. The heart rate may increase more than 30 beats per minute (BPM) above its resting rate when going from a lying to a standing position. POTS occurs more frequently in women than in men.  SYMPTOMS  POTS symptoms  may be increased in the morning. Symptoms of POTS include:  Fainting or near fainting.  Inability to think clearly.  Extreme or chronic fatigue.  Exercise intolerance.  Chest pain.  Having the lower legs develop a reddish-blue color due to decreased blood flow (acrocyanosis). CAUSES POTS can  be caused by different conditions. Sometimes, it has no known cause (idiopathic). Some causes of POTS include:  Viral illness.  Pregnancy.  Autoimmune diseases.  Medications.  Major surgery.  Trauma such as a car accident or major injury.  Medical conditions such as anemia, dehydration, and hyperthyroidism. DIAGNOSIS  POTS is diagnosed by:  Taking a complete history and physical exam.  Measuring the heart rate while lying and then upon standing.  Measuring blood pressure when going from a lying to a standing position. POTS is usually not associated with low blood pressure (othostatic hypotention) when going from a lying to standing position. While standing, blood pressure should be taken 2, 5, and 10 minutes after getting up. TREATMENT  Treatment of POTS depends upon the severity of the symptoms. Treatment includes:  Drinking plenty of fluids to avoid getting dehydrated.  Avoiding very hot environments to not get overheated.  Increasing your dietary salt intake as instructed by your caregiver.  Taking different types of medications as prescribed for POTS.  Avoiding some classes of medications such as vasodilators and diuretics. SEEK IMMEDIATE MEDICAL CARE IF  You have severe chest pain that does not go away. Call your local emergency service immediately.  You feel your heart racing or beating rapidly.  You feel like passing out.  You have very confused thinking. MAKE SURE YOU  Understand these instructions.  Will watch your condition.  Will get help right away if you are not doing well or get worse. Document Released: 01/02/2002 Document Revised: 04/06/2011 Document Reviewed: 03/12/2010 [facility] Patient Information 2013 [place], [place].

## 2012-02-08 NOTE — Assessment & Plan Note (Signed)
Likely POTS and noted to be mildly dehydrated today. encourgaed increased hydration, and caffeine and add a Gatorade daily. Check a CBC, renal, TSH and have her return in 3 months time or as needed for further evaluation

## 2012-02-08 NOTE — Assessment & Plan Note (Signed)
Started on Ciprofloxacin 500 mg bid, Pyridium prn, increase rest and fluids. Urine sent for culture

## 2012-02-09 NOTE — Progress Notes (Signed)
Quick Note:  Patients mother Informed and voiced understanding ______

## 2012-02-10 LAB — URINE CULTURE: Colony Count: >=100000

## 2012-02-10 MED ORDER — SULFAMETHOXAZOLE-TMP DS 800-160 MG PO TABS: 1.0000 | ORAL_TABLET | Freq: Two times a day (BID) | ORAL | Status: DC

## 2012-02-10 NOTE — Addendum Note (Signed)
Addended by: Court Joy on: 02/10/2012 05:05 PM   Modules accepted: Orders

## 2012-02-10 NOTE — Progress Notes (Signed)
Quick Note:  Patient Informed and voiced understanding ______ 

## 2012-05-09 ENCOUNTER — Ambulatory Visit: Payer: Medicaid Other | Admitting: Family Medicine

## 2012-05-16 ENCOUNTER — Ambulatory Visit: Payer: Medicaid Other | Admitting: Family Medicine

## 2012-11-24 ENCOUNTER — Encounter: Payer: Self-pay | Admitting: Family Medicine

## 2012-11-24 ENCOUNTER — Ambulatory Visit (INDEPENDENT_AMBULATORY_CARE_PROVIDER_SITE_OTHER): Payer: Self-pay | Admitting: Family Medicine

## 2012-11-24 ENCOUNTER — Encounter (INDEPENDENT_AMBULATORY_CARE_PROVIDER_SITE_OTHER): Payer: Self-pay

## 2012-11-24 VITALS — BP 106/70 | HR 82 | Temp 98.8°F | Resp 16 | Ht 63.0 in | Wt 108.0 lb

## 2012-11-24 DIAGNOSIS — J029 Acute pharyngitis, unspecified: Secondary | ICD-10-CM

## 2012-11-24 LAB — POCT RAPID STREP A (OFFICE): Rapid Strep A Screen: NEGATIVE

## 2012-11-24 NOTE — Progress Notes (Signed)
OFFICE NOTE  11/24/2012  CC:  Chief Complaint  Patient presents with  . Sore Throat    x 3-4 days     HPI: Patient is a 22 y.o. Caucasian female who is here for sore throat. Nasal congestion/runny nose as well as cough.  No fever.  Some HA intermittently. Occ nausea with excessive PND.  No rash. She is a sometimes smoker.  No OTC meds have been tried.   Pertinent PMH:  Past Medical History  Diagnosis Date  . Hidradenitis 09/24/2010  . Tobacco abuse 09/24/2010  . ADHD (attention deficit hyperactivity disorder) 09/24/2010  . Folliculitis 09/24/2010  . Pregnancy 07/23/2011  . Dysuria 02/08/2012  . Hypotension 02/08/2012   Past surgical, social, and family history reviewed and no changes noted since last office visit.  MEDS:  NONE  PE: Blood pressure 106/70, pulse 82, temperature 98.8 F (37.1 C), temperature source Temporal, resp. rate 16, height 5\' 3"  (1.6 m), weight 108 lb (48.988 kg), last menstrual period 11/14/2012, SpO2 99.00%, not currently breastfeeding. VS: noted--normal. Gen: alert, NAD, NONTOXIC APPEARING. HEENT: eyes without injection, drainage, or swelling.  Ears: EACs clear, TMs with normal light reflex and landmarks.  Nose: Clear rhinorrhea, with some dried, crusty exudate adherent to mildly injected mucosa.  No purulent d/c.  No paranasal sinus TTP.  No facial swelling.  Throat and mouth without focal lesion.  No pharyngial swelling, erythema, or exudate.   Neck: supple, no LAD.   LUNGS: CTA bilat, nonlabored resps.   CV: RRR, no m/r/g. EXT: no c/c/e SKIN: no rash  LAB: Rapid strep NEG  IMPRESSION AND PLAN:  Viral URI with PND pharyngitis. Discussed symptomatic care. Norel AD samples given today. Throat culture sent today.  An After Visit Summary was printed and given to the patient.  FOLLOW UP: prn

## 2012-11-24 NOTE — Addendum Note (Signed)
Addended by: Jeoffrey Massed on: 11/24/2012 03:25 PM   Modules accepted: Orders, Medications

## 2013-04-04 ENCOUNTER — Ambulatory Visit: Payer: Self-pay | Admitting: Family Medicine

## 2013-04-05 ENCOUNTER — Encounter: Payer: Self-pay | Admitting: Family Medicine

## 2013-04-05 ENCOUNTER — Ambulatory Visit (INDEPENDENT_AMBULATORY_CARE_PROVIDER_SITE_OTHER): Payer: BC Managed Care – PPO | Admitting: Family Medicine

## 2013-04-05 VITALS — BP 108/70 | HR 80 | Temp 98.7°F | Resp 16 | Ht 63.0 in | Wt 108.0 lb

## 2013-04-05 DIAGNOSIS — R42 Dizziness and giddiness: Secondary | ICD-10-CM

## 2013-04-05 DIAGNOSIS — R209 Unspecified disturbances of skin sensation: Secondary | ICD-10-CM | POA: Insufficient documentation

## 2013-04-05 DIAGNOSIS — R488 Other symbolic dysfunctions: Secondary | ICD-10-CM

## 2013-04-05 DIAGNOSIS — R252 Cramp and spasm: Secondary | ICD-10-CM

## 2013-04-05 LAB — CBC WITH DIFFERENTIAL/PLATELET
Basophils Absolute: 0 10*3/uL (ref 0.0–0.1)
Basophils Relative: 0.5 % (ref 0.0–3.0)
EOS ABS: 0.1 10*3/uL (ref 0.0–0.7)
EOS PCT: 1 % (ref 0.0–5.0)
HEMATOCRIT: 37.9 % (ref 36.0–46.0)
Hemoglobin: 12.6 g/dL (ref 12.0–15.0)
LYMPHS ABS: 1.6 10*3/uL (ref 0.7–4.0)
Lymphocytes Relative: 28.4 % (ref 12.0–46.0)
MCHC: 33.2 g/dL (ref 30.0–36.0)
MCV: 88 fl (ref 78.0–100.0)
MONO ABS: 0.3 10*3/uL (ref 0.1–1.0)
Monocytes Relative: 6.1 % (ref 3.0–12.0)
NEUTROS PCT: 64 % (ref 43.0–77.0)
Neutro Abs: 3.6 10*3/uL (ref 1.4–7.7)
PLATELETS: 190 10*3/uL (ref 150.0–400.0)
RBC: 4.31 Mil/uL (ref 3.87–5.11)
RDW: 14.8 % — ABNORMAL HIGH (ref 11.5–14.6)
WBC: 5.6 10*3/uL (ref 4.5–10.5)

## 2013-04-05 LAB — COMPREHENSIVE METABOLIC PANEL
ALBUMIN: 4.3 g/dL (ref 3.5–5.2)
ALK PHOS: 48 U/L (ref 39–117)
ALT: 11 U/L (ref 0–35)
AST: 21 U/L (ref 0–37)
BILIRUBIN TOTAL: 0.5 mg/dL (ref 0.3–1.2)
BUN: 11 mg/dL (ref 6–23)
CO2: 26 mEq/L (ref 19–32)
Calcium: 8.9 mg/dL (ref 8.4–10.5)
Chloride: 106 mEq/L (ref 96–112)
Creatinine, Ser: 0.8 mg/dL (ref 0.4–1.2)
GFR: 98.11 mL/min (ref >60.00)
GLUCOSE: 79 mg/dL (ref 70–99)
POTASSIUM: 3.8 meq/L (ref 3.5–5.1)
SODIUM: 137 meq/L (ref 135–145)
TOTAL PROTEIN: 6.6 g/dL (ref 6.0–8.3)

## 2013-04-05 NOTE — Progress Notes (Signed)
OFFICE NOTE  04/05/2013  CC:  Chief Complaint  Patient presents with  . Dizziness    "feels weird"  . jittery  . Fatigue     HPI: Patient is a 23 y.o. Caucasian female who is here for several complaints. Describes motion or posture-induced sx's that she has trouble describing/explaining. No vertigo.  Says she briefly feels like she perceives things visually in a different way than normal.  Says during these times her fingers feel either tight or loose--she often uses the term "draws up".   Very brief period of lack of coordination coincident with the above-described feeling. This has been going on approximately 6-7 yrs.  She feels like it is happening more often lately. Yesterday felt an all-over tingling sensation, a bit of tremulousness all over. The feelings/episodes usually last seconds to a few minutes.  Never hallucinates, never hears voices, never loses consciousness.   No loss of bowel/bladder during these times.  No depressed mood.  Being in a rush or being anxious (such as out in public) makes it happen more often.  LMP was 1 week ago.  Describes menses as heavy. She does not take a MVI or iron supplement.   Pertinent PMH:  Past Medical History  Diagnosis Date  . Hidradenitis 09/24/2010  . Tobacco abuse 09/24/2010  . ADHD (attention deficit hyperactivity disorder) 09/24/2010  . Folliculitis 09/24/2010  . Pregnancy 07/23/2011  . Dysuria 02/08/2012  . Hypotension 02/08/2012   Past Surgical History  Procedure Laterality Date  . Double hernia   23 yrs old    operation  . Tonsillectomy  14    MEDS:  NONE  PE: Blood pressure 108/70, pulse 80, temperature 98.7 F (37.1 C), temperature source Temporal, resp. rate 16, height 5\' 3"  (1.6 m), weight 108 lb (48.988 kg), last menstrual period 03/31/2013, SpO2 98.00%, not currently breastfeeding. Gen: Alert, well appearing.  Patient is oriented to person, place, time, and situation. WUJ:WJXBENT:Eyes: no injection, icteris, swelling,  or exudate.  EOMI, PERRLA. Mouth: lips without lesion/swelling.  Oral mucosa pink and moist. Oropharynx without erythema, exudate, or swelling.  CV: RRR, no m/r/g.   LUNGS: CTA bilat, nonlabored resps, good aeration in all lung fields. Neuro: CN 2-12 intact bilaterally, strength 5/5 in proximal and distal upper extremities and lower extremities bilaterally.  No sensory deficits.  No tremor.  No disdiadochokinesis.  No ataxia.  Upper extremity and lower extremity DTRs symmetric.  No pronator drift.  LAB: none today  IMPRESSION AND PLAN:  Disturbed sensory perception Very difficult for pt to describe but very real. Question of partial seizures. Her anxiety and her low normal bp are likely contributing to the way she feels but I don't think they are the root cause. Will check CBC and CMET today. I will ask a neurologist to evaluate her further.   An After Visit Summary was printed and given to the patient.  FOLLOW UP: prn

## 2013-04-05 NOTE — Assessment & Plan Note (Signed)
Very difficult for pt to describe but very real. Question of partial seizures. Her anxiety and her low normal bp are likely contributing to the way she feels but I don't think they are the root cause. Will check CBC and CMET today. I will ask a neurologist to evaluate her further.

## 2013-04-05 NOTE — Progress Notes (Signed)
Pre visit review using our clinic review tool, if applicable. No additional management support is needed unless otherwise documented below in the visit note. 

## 2013-04-07 ENCOUNTER — Ambulatory Visit (INDEPENDENT_AMBULATORY_CARE_PROVIDER_SITE_OTHER): Payer: BC Managed Care – PPO | Admitting: Neurology

## 2013-04-07 ENCOUNTER — Encounter: Payer: Self-pay | Admitting: Neurology

## 2013-04-07 VITALS — BP 98/60 | HR 78 | Temp 98.1°F | Ht 62.0 in | Wt 109.1 lb

## 2013-04-07 DIAGNOSIS — R569 Unspecified convulsions: Secondary | ICD-10-CM | POA: Insufficient documentation

## 2013-04-07 NOTE — Progress Notes (Signed)
NEUROLOGY CONSULTATION NOTE  Tonya Kramer MRN: [id number] DOB: [birth year]  Referring provider: Dr. Nicoletta Ba  Primary care provider: Dr. Nicoletta Ba  Reason for consult:  "Disturbed sensory perception"  Dear Dr Milinda Cave:  Thank you for your kind referral of Tonya Kramer for consultation of the above symptoms. Although her history is well known to you, please allow me to reiterate it for the purpose of our medical record. The patient was accompanied to the clinic by her husband who also provides collateral information.   HISTORY OF PRESENT ILLNESS: This is a very pleasant 23 year old right-handed woman with no significant past medical history presenting with a 10 year history of recurrent episodes that she calls "draw ups."  She describes the sensation as feeling "like I'm there and I know what is going on, but everything looks different, like I don't recognize it."  It is difficult to talk during them, and her husband has witnessed one episode stating she couldn't answer questions. Her hands would ball up or flex at the fingers, but not clearly dystonic. After the episode, she feels slightly confused but can still function.  When she was younger, it did not affect her schoolwork, although she did have trouble remembering things, needing to read and re-read a book 3 times.  Episodes have increased in frequency, recently she had 6 in one day.  They typically last 2 minutes at most.  There are no clear triggers, sometimes they occur when she is more anxious, but not consistently.  One time she was holding her son and it felt like she had to let go but she was able to keep on holding him.    She has infrequent frontal pressure-like headaches lasting 30 minutes for the past 2 years, she will be seeing ophtho tomorrow.  She denies any dizziness, diplopia, dysarthria, dysphagia, neck/back pain, focal numbness/tingling/weakness, bowel/bladder dysfunction.  She denies any gaps in time,  staring/unresponsive episodes, olfactory/gustatory hallucinations, rising epigastric sensation, myoclonic jerks.  No nausea, vomiting, chest pain, palpitations, shortness of breath. She always feels tired, with poor sleep.  She has 2 toddlers.  Epilepsy Risk Factors:  A maternal cousin has seizures since childhood.  Otherwise she had a normal birth and early development, no history of febrile convulsions, CNS infections, significant TBI, or neurosurgical procedures.  Records were personally reviewed where available.   Lab Results  Component Value Date   WBC 5.6 04/05/2013   HGB 12.6 04/05/2013   HCT 37.9 04/05/2013   MCV 88.0 04/05/2013   PLT 190.0 04/05/2013     Chemistry      Component Value Date/Time   NA 137 04/05/2013 1040   K 3.8 04/05/2013 1040   CL 106 04/05/2013 1040   CO2 26 04/05/2013 1040   BUN 11 04/05/2013 1040   CREATININE 0.8 04/05/2013 1040   CREATININE 0.75 02/08/2012 1624      Component Value Date/Time   CALCIUM 8.9 04/05/2013 1040   ALKPHOS 48 04/05/2013 1040   AST 21 04/05/2013 1040   ALT 11 04/05/2013 1040   BILITOT 0.5 04/05/2013 1040       PAST MEDICAL HISTORY: Past Medical History  Diagnosis Date  . Hidradenitis 09/24/2010  . Tobacco abuse 09/24/2010  . ADHD (attention deficit hyperactivity disorder) 09/24/2010  . Folliculitis 09/24/2010  . Pregnancy 07/23/2011  . Dysuria 02/08/2012  . Hypotension 02/08/2012    PAST SURGICAL HISTORY: Past Surgical History  Procedure Laterality Date  . Double hernia   23 yrs  old    operation  . Tonsillectomy  14    MEDICATIONS: No current outpatient prescriptions on file prior to visit.   No current facility-administered medications on file prior to visit.    ALLERGIES: No Known Allergies  FAMILY HISTORY: Family History  Problem Relation Age of Onset  . Hyperlipidemia Mother   . Fibromyalgia Mother   . Irritable bowel syndrome Mother   . Cancer Mother     skin cancer on face  . Arthritis Mother   . Tuberculosis  Mother     carrier  . Hypertension Father   . Asthma Sister   . Cancer Brother     cancer on rib  . Other Brother     heart beat slow  . Arthritis Maternal Grandmother     hands and feet  . Hypertension Maternal Grandfather   . Heart disease Maternal Grandfather   . Cancer Maternal Grandfather     prostate  . COPD Maternal Grandfather     smoker  . Emphysema Maternal Grandfather     smoker  . Migraines Paternal Grandmother   . Depression Paternal Grandmother   . Other Paternal Grandmother     hip replacement  . Arthritis Paternal Grandfather   . Drug abuse Paternal Grandfather   . Asthma Sister     SOCIAL HISTORY: History   Social History  . Marital Status: Married    Spouse Name: N/A    Number of Children: N/A  . Years of Education: N/A   Occupational History  . Not on file.   Social History Main Topics  . Smoking status: Former Smoker -- 1.00 packs/day for 3 years    Types: Cigarettes  . Smokeless tobacco: Never Used  . Alcohol Use: No  . Drug Use: No  . Sexual Activity: Not Currently   Other Topics Concern  . Not on file   Social History Narrative  . No narrative on file    REVIEW OF SYSTEMS: Constitutional: No fevers, chills, or sweats, + generalized fatigue, no change in appetite Eyes: +blurred vision, no double vision, eye pain Ear, nose and throat: No hearing loss, ear pain, nasal congestion, sore throat Cardiovascular: No chest pain, palpitations Respiratory:  No shortness of breath at rest or with exertion, wheezes GastrointestinaI: No nausea, vomiting, diarrhea, abdominal pain, fecal incontinence Genitourinary:  No dysuria, urinary retention or frequency Musculoskeletal:  No neck pain, back pain Integumentary: No rash, pruritus, skin lesions Neurological: as above Psychiatric: No depression, insomnia, anxiety Endocrine: No palpitations, fatigue, diaphoresis, mood swings, change in appetite, change in weight, increased  thirst Hematologic/Lymphatic:  No anemia, purpura, petechiae. Allergic/Immunologic: no itchy/runny eyes, nasal congestion, recent allergic reactions, rashes  PHYSICAL EXAM: Filed Vitals:   04/07/13 0947  BP: 98/60  Pulse: 78  Temp: 98.1 F (36.7 C)   General: No acute distress Head:  Normocephalic/atraumatic Neck: supple, no paraspinal tenderness, full range of motion Back: No paraspinal tenderness Heart: regular rate and rhythm Lungs: Clear to auscultation bilaterally. Vascular: No carotid bruits. Skin/Extremities: No rash, no edema Neurological Exam: Mental status: alert and oriented to person, place, and time, no dysarthria or dysphagia. Fund of knowledge is appropriate.  Recent and remote memory intact.  Attention span and concentration normal.  Repeats and names without difficulty. Cranial nerves: CN I: not tested CN II: pupils equal, round and reactive to light, visual fields intact, fundoscopy: sharp disc on left, blurred on right CN III, IV, VI:  full range of motion, no nystagmus, no ptosis  CN V: facial sensation intact CN VII: upper and lower face symmetric CN VIII: hearing intact CN IX, X: gag intact, uvula midline CN XI: sternocleidomastoid and trapezius muscles intact CN XII: tongue midline Bulk & Tone: normal, no fasciculations. Motor: 5/5 throughout with no pronator drift. Sensation: intact to light touch, cold, pin, vibration and joint position sense.  No extinction to double simultaneous stimulation.  Romberg test negative Deep Tendon Reflexes: +2 throughout, no clonus Plantar responses: downgoing bilaterally Finger to nose testing: no incoordination Gait: narrow-based and steady, able to tandem walk adequately.  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  Partial seizures are a consideration, and MRI brain with and without contrast and routine EEG will be ordered to assess for underlying focal abnormalities that increase risk for recurrent seizures.  If routine EEG is normal, a 24-hour EEG will be ordered to further classify these events.  She will follow-up after the tests.    Thank you for allowing me to participate in the care of this patient. Please do not hesitate to call for any questions or concerns.   Patrcia Dolly, M.D.

## 2013-04-07 NOTE — Patient Instructions (Addendum)
1. MRI brain with and without contrast March 31 @ 1pm please [facility] 15 minutes prior [facility] [phone] 2. Routine EEG March 26 @ 11am please arrive 15 minutes prior for check in [address] [phone] 3. Follow-up after the tests

## 2013-04-14 ENCOUNTER — Ambulatory Visit: Payer: BC Managed Care – PPO | Admitting: Neurology

## 2013-04-20 ENCOUNTER — Ambulatory Visit (HOSPITAL_COMMUNITY)
Admission: RE | Admit: 2013-04-20 | Discharge: 2013-04-20 | Disposition: A | Discharge status: Home / Self Care | Payer: BC Managed Care – PPO | Source: Ambulatory Visit | Attending: Neurology | Admitting: Neurology

## 2013-04-20 DIAGNOSIS — R569 Unspecified convulsions: Secondary | ICD-10-CM | POA: Insufficient documentation

## 2013-04-20 NOTE — Progress Notes (Signed)
Routine EEG completed as OP. 

## 2013-04-21 NOTE — Procedures (Signed)
ELECTROENCEPHALOGRAM REPORT  Date of Study: 04/20/2013  Patient's Name: Tonya Kramer MRN: [id number] Date of Birth: 01/09/1991  Referring Provider: Dr. Patrcia DollyKaren Allayna Erlich  Clinical History: This is a 23 year old woman recurrent stereotyped episodes of possible jamais vu where she momentarily does not recognize familiar surroundings, associated with some "drawing up" of the hands.  Medications: None  Technical Summary: A multichannel digital EEG recording measured by the international 10-20 system with electrodes applied with paste and impedances below 5000 ohms performed in our laboratory with EKG monitoring in an awake and drowsy patient.  Hyperventilation and photic stimulation were performed.  The digital EEG was referentially recorded, reformatted, and digitally filtered in a variety of bipolar and referential montages for optimal display.  Spike detection software was employed.  Description: The patient is awake and drowsy during the recording.  During maximal wakefulness, there is a symmetric, medium voltage 9.5-10 Hz posterior dominant rhythm that attenuates with eye opening.  The record is symmetric.  During drowsiness, there is an increase in theta slowing of the background.  Hyperventilation and photic stimulation did not elicit any abnormalities. There was occasional asymmetry of driving response noted with higher amplitude over the right.  There were no epileptiform discharges or electrographic seizures seen.    EKG lead was unremarkable.  Impression: This awake and drowsy EEG is normal.    Clinical Correlation: A normal EEG does not exclude a clinical diagnosis of epilepsy.  If further clinical questions remain, prolonged EEG may be helpful. Clinical correlation is advised.   Patrcia DollyKaren Abel Ra, M.D.

## 2013-04-25 ENCOUNTER — Ambulatory Visit (HOSPITAL_COMMUNITY)
Admission: RE | Admit: 2013-04-25 | Discharge: 2013-04-25 | Disposition: A | Discharge status: Home / Self Care | Payer: BC Managed Care – PPO | Source: Ambulatory Visit | Attending: Neurology | Admitting: Neurology

## 2013-04-25 ENCOUNTER — Other Ambulatory Visit: Payer: Self-pay | Admitting: *Deleted

## 2013-04-25 DIAGNOSIS — R569 Unspecified convulsions: Secondary | ICD-10-CM | POA: Insufficient documentation

## 2013-04-25 MED ORDER — GADOBENATE DIMEGLUMINE 529 MG/ML IV SOLN
10.0000 mL | Freq: Once | INTRAVENOUS | Status: AC | PRN
  Administered 2013-04-25: 10 mL via INTRAVENOUS

## 2013-05-05 ENCOUNTER — Ambulatory Visit (INDEPENDENT_AMBULATORY_CARE_PROVIDER_SITE_OTHER): Payer: BC Managed Care – PPO | Admitting: Neurology

## 2013-05-05 ENCOUNTER — Encounter: Payer: Self-pay | Admitting: Neurology

## 2013-05-05 VITALS — BP 104/68 | HR 77 | Wt 106.4 lb

## 2013-05-05 DIAGNOSIS — R569 Unspecified convulsions: Secondary | ICD-10-CM

## 2013-05-05 NOTE — Patient Instructions (Signed)
1. Proceed with 24-hour EEG on 05/10/13 2. Keep a calendar of symptoms 3. Follow-up in 3 months

## 2013-05-05 NOTE — Progress Notes (Signed)
NEUROLOGY FOLLOW UP OFFICE NOTE  Tonya Kramer [id number]  HISTORY OF PRESENT ILLNESS: I had the pleasure of seeing Tonya Kramer in follow-up in the neurology clinic on 05/05/2013.  The patient was last seen a month ago for recurrent stereotyped episodes of possible jamais vu where she momentarily does not recognize familiar surroundings, associated with some "drawing up" of the hands.  She is again accompanied by her husband today.  Records and images were personally reviewed where available.  I personally reviewed MRI brain with and without contrast and routine EEG which were normal.  Over the past month, she has only had 1 intense episode where her hands were affected.  She had 3-4 milder episodes of "just the feeling," but symptoms did not progress.  She endorses less stress over the past month, reporting she is not as stressed.  She denies any headache, dizziness, focal numbness/tingling/weakness.  HPI:  This is a very pleasant 23 yo RH woman with no significant past medical history presenting with a 10 year history of recurrent episodes that she calls "draw ups." She describes the sensation as feeling "like I'm there and I know what is going on, but everything looks different, like I don't recognize it." It is difficult to talk during them, and her husband has witnessed one episode stating she couldn't answer questions. Her hands would ball up or flex at the fingers, but not clearly dystonic. After the episode, she feels slightly confused but can still function. When she was younger, it did not affect her schoolwork, although she did have trouble remembering things, needing to read and re-read a book 3 times. Episodes have increased in frequency, recently she had 6 in one day. They typically last 2 minutes at most. There are no clear triggers, sometimes they occur when she is more anxious, but not consistently. One time she was holding her son and it felt like she had to let go but she was able to  keep on holding him.   Epilepsy Risk Factors: A maternal cousin has seizures since childhood. Otherwise she had a normal birth and early development, no history of febrile convulsions, CNS infections, significant TBI, or neurosurgical procedures  PAST MEDICAL HISTORY: Past Medical History  Diagnosis Date  . Hidradenitis 09/24/2010  . Tobacco abuse 09/24/2010  . ADHD (attention deficit hyperactivity disorder) 09/24/2010  . Folliculitis 09/24/2010  . Pregnancy 07/23/2011  . Dysuria 02/08/2012  . Hypotension 02/08/2012    MEDICATIONS: No current outpatient prescriptions on file prior to visit.   No current facility-administered medications on file prior to visit.    ALLERGIES: No Known Allergies  FAMILY HISTORY: Family History  Problem Relation Age of Onset  . Hyperlipidemia Mother   . Fibromyalgia Mother   . Irritable bowel syndrome Mother   . Cancer Mother     skin cancer on face  . Arthritis Mother   . Tuberculosis Mother     carrier  . Hypertension Father   . Asthma Sister   . Cancer Brother     cancer on rib  . Other Brother     heart beat slow  . Arthritis Maternal Grandmother     hands and feet  . Hypertension Maternal Grandfather   . Heart disease Maternal Grandfather   . Cancer Maternal Grandfather     prostate  . COPD Maternal Grandfather     smoker  . Emphysema Maternal Grandfather     smoker  . Migraines Paternal Grandmother   . Depression Paternal  Grandmother   . Other Paternal Grandmother     hip replacement  . Arthritis Paternal Grandfather   . Drug abuse Paternal Grandfather   . Asthma Sister     SOCIAL HISTORY: History   Social History  . Marital Status: Married    Spouse Name: N/A    Number of Children: N/A  . Years of Education: N/A   Occupational History  . Not on file.   Social History Main Topics  . Smoking status: Former Smoker -- 1.00 packs/day for 3 years    Types: Cigarettes  . Smokeless tobacco: Never Used  . Alcohol Use:  No  . Drug Use: No  . Sexual Activity: Not Currently   Other Topics Concern  . Not on file   Social History Narrative  . No narrative on file    REVIEW OF SYSTEMS: Constitutional: No fevers, chills, or sweats, no generalized fatigue, change in appetite Eyes: No visual changes, double vision, eye pain Ear, nose and throat: No hearing loss, ear pain, nasal congestion, sore throat Cardiovascular: No chest pain, palpitations Respiratory:  No shortness of breath at rest or with exertion, wheezes GastrointestinaI: No nausea, vomiting, diarrhea, abdominal pain, fecal incontinence Genitourinary:  No dysuria, urinary retention or frequency Musculoskeletal:  No neck pain, back pain Integumentary: No rash, pruritus, skin lesions Neurological: as above Psychiatric: No depression, insomnia, anxiety Endocrine: No palpitations, fatigue, diaphoresis, mood swings, change in appetite, change in weight, increased thirst Hematologic/Lymphatic:  No anemia, purpura, petechiae. Allergic/Immunologic: no itchy/runny eyes, nasal congestion, recent allergic reactions, rashes  PHYSICAL EXAM: Filed Vitals:   05/05/13 1016  BP: 104/68  Pulse: 77   General: No acute distress Head:  Normocephalic/atraumatic Neck: supple, no paraspinal tenderness, full range of motion Heart:  Regular rate and rhythm Lungs:  Clear to auscultation bilaterally Back: No paraspinal tenderness Skin/Extremities: No rash, no edema Neurological Exam: alert and oriented to person, place, and time. No aphasia or dysarthria. Fund of knowledge is appropriate.  Recent and remote memory are intact.  Attention and concentration are normal.    Able to name objects and repeat phrases. Cranial nerves: Pupils equal, round, reactive to light.  Fundoscopic exam unremarkable, no papilledema. Extraocular movements intact with no nystagmus. Visual fields full. Facial sensation intact. No facial asymmetry. Tongue, uvula, palate midline.  Motor: Bulk  and tone normal, muscle strength 5/5 throughout with no pronator drift.  Sensation to light touch, temperature and vibration intact.  No extinction to double simultaneous stimulation.  Deep tendon reflexes 2+ throughout, toes downgoing.  Finger to nose testing intact.  Gait narrow-based and steady, able to tandem walk adequately.  Romberg negative.  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  Her brain MRI and routine EEG are normal.  She had 3-4 milder episodes over the past month, and one more intense episode where both hands were involved.  She is scheduled for a 24-hour EEG to capture and classify these episodes to assess for recurrent seizures.  She is aware of [place] driving laws to stop driving after an episode of loss of awareness, until 6 months event-free.  She will keep a calendar of her symptoms and follow-up in 3 months or earlier if needed.  Thank you for allowing me to participate in her care.  Please do not hesitate to call for any questions  or concerns.  The duration of this appointment visit was 15 minutes of face-to-face time with the patient.  Greater than 50% of this time was spent in counseling, explanation of diagnosis, planning of further management, and coordination of care.   Ellouise Newer, M.D.

## 2013-05-07 ENCOUNTER — Encounter: Payer: Self-pay | Admitting: Neurology

## 2013-05-10 ENCOUNTER — Ambulatory Visit (HOSPITAL_COMMUNITY)
Admission: RE | Admit: 2013-05-10 | Discharge: 2013-05-10 | Disposition: A | Discharge status: Home / Self Care | Payer: BC Managed Care – PPO | Source: Ambulatory Visit | Attending: Neurology | Admitting: Neurology

## 2013-05-10 DIAGNOSIS — R569 Unspecified convulsions: Secondary | ICD-10-CM

## 2013-05-10 NOTE — Progress Notes (Signed)
24 hour AEEG started, pt will return tomorrow to D/C.

## 2013-05-23 ENCOUNTER — Telehealth: Payer: Self-pay | Admitting: Neurology

## 2013-05-23 NOTE — Telephone Encounter (Signed)
Left VM on cel listed, 24-hour EEG normal. Asked patient to call back regarding the test.

## 2013-05-30 NOTE — Procedures (Signed)
ELECTROENCEPHALOGRAM REPORT  Dates of Recording: 05/10/2013 to 05/11/2013  Patient's Name: Tonya Kramer MRN: [id number] Date of Birth: 11-15-1990  Referring Provider: Dr. 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 EEG for classification to assess for recurrent seizures.  Medications: None  Technical Summary: This is a 24-hour multichannel digital EEG recording measured by the international 10-20 system with electrodes applied with paste and impedances below 5000 ohms performed as portable with EKG monitoring.  The digital EEG was referentially recorded, reformatted, and digitally filtered in a variety of bipolar and referential montages for optimal display.    DESCRIPTION OF RECORDING: During maximal wakefulness, the background activity consisted of a symmetric 10.5-11 Hz posterior dominant rhythm which was reactive to eye opening.  There were no epileptiform discharges or focal slowing seen in wakefulness.  During the recording, the patient progresses through wakefulness, drowsiness, and Stage 2 sleep.  Again, there were no epileptiform discharges seen.  Events: Patient did not complete diary.  Push button events did not show any EEG correlate.  There were no electrographic seizures seen.  EKG lead was unremarkable.   IMPRESSION: This 24-hour ambulatory EEG study is normal.    CLINICAL CORRELATION: A normal EEG does not exclude a clinical diagnosis of epilepsy.  Patient did not complete diary, push button events did not show electrographic correlate. If further clinical questions remain, inpatient video EEG monitoring may be helpful.   Patrcia DollyKaren Honesti Seaberg, M.D.

## 2013-08-07 ENCOUNTER — Telehealth: Payer: Self-pay | Admitting: Neurology

## 2013-08-07 ENCOUNTER — Ambulatory Visit: Payer: BC Managed Care – PPO | Admitting: Neurology

## 2013-08-07 NOTE — Telephone Encounter (Signed)
Mom called, pt has gotten lost coming to her appt today. Marked as a no show but no letter sent. Mom will have pt call at a later date to r/s / Sherri S.

## 2013-10-30 ENCOUNTER — Other Ambulatory Visit: Payer: Self-pay | Admitting: Obstetrics and Gynecology

## 2013-10-30 DIAGNOSIS — N6002 Solitary cyst of left breast: Secondary | ICD-10-CM

## 2013-11-01 ENCOUNTER — Ambulatory Visit
Admission: RE | Admit: 2013-11-01 | Discharge: 2013-11-01 | Disposition: A | Discharge status: Home / Self Care | Payer: BC Managed Care – PPO | Source: Ambulatory Visit | Attending: Obstetrics and Gynecology | Admitting: Obstetrics and Gynecology

## 2013-11-01 ENCOUNTER — Other Ambulatory Visit: Payer: BC Managed Care – PPO

## 2013-11-01 DIAGNOSIS — N6002 Solitary cyst of left breast: Secondary | ICD-10-CM

## 2013-11-20 NOTE — H&P (Signed)
Tonya Kramer  DICTATION # [id number] CSN# [id number]   [name],[name], MD 11/20/2013 1:50 PM

## 2013-11-23 NOTE — H&P (Signed)
NAMAntonieta Kramer:  Tonya Kramer, Tonya Kramer                 ACCOUNT NO.:  [id number]  MEDICAL RECORD NO.:  [id number]  LOCATION:                                 FACILITY:  PHYSICIAN:  Duke Salviaichard M. Marcelle Kramer, M.D.    DATE OF BIRTH:  DATE OF ADMISSION:  12/01/2013 DATE OF DISCHARGE:                             HISTORY & PHYSICAL   CHIEF COMPLAINT:  Insertional dyspareunia.  HPI:  A 23 year old, G2, P2, has 293 and 23-year-old, currently using condoms.  She has noted since her last delivery that she has a lot of insertional dyspareunia, she thinks related to redundant labia minora. As part of her evaluation, she had GYN ultrasound performed on September 09, 2013, that showed no abnormalities.  On exam, she did have excessive labial scan due to the insertional discomfort, pinching and tight closing.  She prefers to go ahead and have reduction labioplasty which we discussed.  This procedure including specific risks related to bleeding, infection, along with her expected recovery time.  All discussed which she understands and accepts.  PAST MEDICAL HISTORY:  Allergies none.  CURRENT MEDICATIONS:  None.  REVIEW OF SYSTEMS:  Otherwise negative except for history of UTI.  She has had 2 vaginal deliveries, hernia repair, and tonsillectomy.  FAMILY HISTORY:  Significant for migraine headache, heart disease, IBS, anemia, and unspecified type of cancer.  SOCIAL HISTORY:  Denies drug or tobacco use.  She does drink socially. She is married.  PHYSICAL EXAMINATION:  VITAL SIGNS:  Temp 98.2, blood pressure 120/62. HEENT:  Unremarkable. NECK:  Supple without masses. LUNGS:  Clear. CARDIOVASCULAR:  Regular rate and rhythm without murmurs, rubs, gallops. BREASTS:  Without masses. ABDOMEN:  Soft, flat, nontender. PELVIC:  Vulva was unremarkable except for redundant labia minora. Remainder of her vaginal exam and bimanual exam normal. EXTREMITIES:  Unremarkable. NEUROLOGIC:  Unremarkable.  IMPRESSION:  Redundant labia  minora, causing insertional dyspareunia and discomfort at other times due to pinching of excess skin.  PLAN:  Reduction labioplasty.  Procedure and risks were reviewed as above.     Tonya Kramer, M.D.     RM[facility]/MEDQ  D:  11/20/2013  T:  11/21/2013  Job:  [id number]

## 2013-11-27 ENCOUNTER — Encounter: Payer: Self-pay | Admitting: Neurology

## 2013-11-30 ENCOUNTER — Encounter (HOSPITAL_BASED_OUTPATIENT_CLINIC_OR_DEPARTMENT_OTHER): Payer: Self-pay | Admitting: *Deleted

## 2013-11-30 NOTE — Progress Notes (Signed)
NPO AFTER MN.  ARRIVE AT 0600.  NEEDS HG AND URINE PREG.  

## 2013-11-30 NOTE — Anesthesia Preprocedure Evaluation (Addendum)
Anesthesia Evaluation  Patient identified by MRN, date of birth, ID band Patient awake    Reviewed: Allergy & Precautions, H&P , NPO status , Patient's Chart, lab work & pertinent test results  History of Anesthesia Complications (+) history of anesthetic complications (Patient denies any hx of anesthetic complications, reports a history of PONV with mother)  Airway Mallampati: I  TM Distance: >3 FB Neck ROM: Full    Dental no notable dental hx. (+) Dental Advisory Given, Teeth Intact   Pulmonary former smoker,  breath sounds clear to auscultation  Pulmonary exam normal       Cardiovascular Exercise Tolerance: Good negative cardio ROS  Rhythm:Regular Rate:Normal     Neuro/Psych PSYCHIATRIC DISORDERS (ADHD) Anxiety Patient denies a history of seizures negative neurological ROS     GI/Hepatic negative GI ROS, Neg liver ROS,   Endo/Other  negative endocrine ROS  Renal/GU negative Renal ROS  negative genitourinary   Musculoskeletal negative musculoskeletal ROS (+)   Abdominal   Peds negative pediatric ROS (+)  Hematology negative hematology ROS (+)   Anesthesia Other Findings   Reproductive/Obstetrics negative OB ROS                            Anesthesia Physical Anesthesia Plan  ASA: II  Anesthesia Plan: MAC   Post-op Pain Management:    Induction: Intravenous  Airway Management Planned: Nasal Cannula  Additional Equipment:   Intra-op Plan:   Post-operative Plan: Extubation in OR  Informed Consent: I have reviewed the patients History and Physical, chart, labs and discussed the procedure including the risks, benefits and alternatives for the proposed anesthesia with the patient or authorized representative who has indicated his/her understanding and acceptance.   Dental advisory given  Plan Discussed with: CRNA  Anesthesia Plan Comments:        Anesthesia Quick  Evaluation

## 2013-12-01 ENCOUNTER — Ambulatory Visit (HOSPITAL_BASED_OUTPATIENT_CLINIC_OR_DEPARTMENT_OTHER): Payer: BC Managed Care – PPO | Admitting: Anesthesiology

## 2013-12-01 ENCOUNTER — Ambulatory Visit (HOSPITAL_BASED_OUTPATIENT_CLINIC_OR_DEPARTMENT_OTHER)
Admission: RE | Admit: 2013-12-01 | Discharge: 2013-12-01 | Disposition: A | Discharge status: Home / Self Care | Payer: BC Managed Care – PPO | Source: Ambulatory Visit | Attending: Obstetrics and Gynecology | Admitting: Obstetrics and Gynecology

## 2013-12-01 ENCOUNTER — Encounter (HOSPITAL_BASED_OUTPATIENT_CLINIC_OR_DEPARTMENT_OTHER): Payer: Self-pay | Admitting: *Deleted

## 2013-12-01 ENCOUNTER — Encounter (HOSPITAL_BASED_OUTPATIENT_CLINIC_OR_DEPARTMENT_OTHER)
Admission: RE | Disposition: A | Discharge status: Home / Self Care | Payer: Self-pay | Source: Ambulatory Visit | Attending: Obstetrics and Gynecology

## 2013-12-01 DIAGNOSIS — N906 Hypertrophy of vulva: Secondary | ICD-10-CM | POA: Insufficient documentation

## 2013-12-01 DIAGNOSIS — Z87891 Personal history of nicotine dependence: Secondary | ICD-10-CM | POA: Diagnosis not present

## 2013-12-01 DIAGNOSIS — N941 Dyspareunia: Secondary | ICD-10-CM | POA: Diagnosis not present

## 2013-12-01 DIAGNOSIS — F419 Anxiety disorder, unspecified: Secondary | ICD-10-CM | POA: Diagnosis not present

## 2013-12-01 HISTORY — DX: Presence of spectacles and contact lenses: Z97.3

## 2013-12-01 HISTORY — DX: Family history of other specified conditions: Z84.89

## 2013-12-01 HISTORY — PX: LABIOPLASTY: SHX1900

## 2013-12-01 HISTORY — DX: Unspecified dyspareunia: N94.10

## 2013-12-01 LAB — POCT PREGNANCY, URINE: PREG TEST UR: NEGATIVE

## 2013-12-01 LAB — POCT HEMOGLOBIN-HEMACUE: HEMOGLOBIN: 13.5 g/dL (ref 12.0–15.0)

## 2013-12-01 SURGERY — LABIAPLASTY, VULVA
Anesthesia: Monitor Anesthesia Care | Laterality: Bilateral

## 2013-12-01 MED ORDER — DEXAMETHASONE SODIUM PHOSPHATE 4 MG/ML IJ SOLN
INTRAMUSCULAR | Status: DC | PRN
  Administered 2013-12-01: 10 mg via INTRAVENOUS

## 2013-12-01 MED ORDER — ACETAMINOPHEN 10 MG/ML IV SOLN
INTRAVENOUS | Status: DC | PRN
  Administered 2013-12-01: 1000 mg via INTRAVENOUS

## 2013-12-01 MED ORDER — ONDANSETRON HCL 4 MG/2ML IJ SOLN
4.0000 mg | Freq: Once | INTRAMUSCULAR | Status: DC | PRN
  Filled 2013-12-01: qty 2

## 2013-12-01 MED ORDER — FENTANYL CITRATE 0.05 MG/ML IJ SOLN
INTRAMUSCULAR | Status: AC
  Filled 2013-12-01: qty 2

## 2013-12-01 MED ORDER — KETOROLAC TROMETHAMINE 30 MG/ML IJ SOLN
INTRAMUSCULAR | Status: DC | PRN
  Administered 2013-12-01: 30 mg via INTRAVENOUS

## 2013-12-01 MED ORDER — MIDAZOLAM HCL 2 MG/2ML IJ SOLN
INTRAMUSCULAR | Status: AC
  Filled 2013-12-01: qty 2

## 2013-12-01 MED ORDER — FENTANYL CITRATE 0.05 MG/ML IJ SOLN
25.0000 ug | INTRAMUSCULAR | Status: DC | PRN
  Administered 2013-12-01: 25 ug via INTRAVENOUS
  Filled 2013-12-01: qty 1

## 2013-12-01 MED ORDER — FENTANYL CITRATE 0.05 MG/ML IJ SOLN
INTRAMUSCULAR | Status: AC
  Filled 2013-12-01: qty 4

## 2013-12-01 MED ORDER — PROPOFOL 10 MG/ML IV BOLUS
INTRAVENOUS | Status: DC | PRN
  Administered 2013-12-01: 150 mg via INTRAVENOUS
  Administered 2013-12-01: 40 mg via INTRAVENOUS

## 2013-12-01 MED ORDER — FENTANYL CITRATE 0.05 MG/ML IJ SOLN
INTRAMUSCULAR | Status: DC | PRN
  Administered 2013-12-01: 100 ug via INTRAVENOUS

## 2013-12-01 MED ORDER — LIDOCAINE HCL (CARDIAC) 20 MG/ML IV SOLN
INTRAVENOUS | Status: DC | PRN
  Administered 2013-12-01: 50 mg via INTRAVENOUS

## 2013-12-01 MED ORDER — DEXTROSE 5 % IV SOLN
2.0000 g | INTRAVENOUS | Status: AC
  Administered 2013-12-01: 2 g via INTRAVENOUS
  Filled 2013-12-01: qty 2

## 2013-12-01 MED ORDER — CEFOTETAN DISODIUM-DEXTROSE 2-2.08 GM-% IV SOLR
INTRAVENOUS | Status: AC
  Filled 2013-12-01: qty 50

## 2013-12-01 MED ORDER — PROPOFOL 10 MG/ML IV EMUL
INTRAVENOUS | Status: DC | PRN
  Administered 2013-12-01: 50 ug/kg/min via INTRAVENOUS

## 2013-12-01 MED ORDER — HYDROCODONE-IBUPROFEN 7.5-200 MG PO TABS: 1.0000 | ORAL_TABLET | Freq: Three times a day (TID) | ORAL | Status: DC | PRN

## 2013-12-01 MED ORDER — ONDANSETRON HCL 4 MG/2ML IJ SOLN
INTRAMUSCULAR | Status: DC | PRN
Start: 2013-12-01 — End: 2013-12-01
  Administered 2013-12-01: 4 mg via INTRAVENOUS

## 2013-12-01 MED ORDER — BUPIVACAINE LIPOSOME 1.3 % IJ SUSP
INTRAMUSCULAR | Status: DC | PRN
  Administered 2013-12-01: 6 mL

## 2013-12-01 MED ORDER — LACTATED RINGERS IV SOLN
INTRAVENOUS | Status: DC
  Administered 2013-12-01: 07:00:00 via INTRAVENOUS
  Filled 2013-12-01: qty 1000

## 2013-12-01 SURGICAL SUPPLY — 30 items
BLADE SURG 15 STRL LF DISP TIS (BLADE) ×1 IMPLANT
BLADE SURG 15 STRL SS (BLADE) ×2
CATH ROBINSON RED A/P 16FR (CATHETERS) ×1 IMPLANT
CLOTH BEACON ORANGE TIMEOUT ST (SAFETY) ×2 IMPLANT
COVER TABLE BACK 60X90 (DRAPES) ×2 IMPLANT
DRAPE LG THREE QUARTER DISP (DRAPES) ×2 IMPLANT
ELECT REM PT RETURN 9FT ADLT (ELECTROSURGICAL) ×2
ELECTRODE REM PT RTRN 9FT ADLT (ELECTROSURGICAL) ×1 IMPLANT
GLOVE BIO SURGEON STRL SZ7.5 (GLOVE) ×4 IMPLANT
GLOVE BIOGEL PI IND STRL 6.5 (GLOVE) IMPLANT
GLOVE BIOGEL PI INDICATOR 6.5 (GLOVE) ×1
GLOVE INDICATOR 6.5 STRL GRN (GLOVE) ×1 IMPLANT
GOWN PREVENTION PLUS LG XLONG (DISPOSABLE) ×4 IMPLANT
LEGGING LITHOTOMY PAIR STRL (DRAPES) ×2 IMPLANT
NDL HYPO 25X1 1.5 SAFETY (NEEDLE) IMPLANT
NEEDLE HYPO 25X1 1.5 SAFETY (NEEDLE) ×2 IMPLANT
NS IRRIG 500ML POUR BTL (IV SOLUTION) ×2 IMPLANT
PAD OB MATERNITY 4.3X12.25 (PERSONAL CARE ITEMS) ×2 IMPLANT
PAD PREP 24X48 CUFFED NSTRL (MISCELLANEOUS) ×2 IMPLANT
PENCIL BUTTON HOLSTER BLD 10FT (ELECTRODE) ×2 IMPLANT
SUT MON AB 4-0 PC3 18 (SUTURE) ×3 IMPLANT
SUT VIC AB 3-0 PS2 18 (SUTURE) ×2
SUT VIC AB 3-0 PS2 18XBRD (SUTURE) ×1 IMPLANT
SUT VICRYL RAPIDE 4/0 PS 2 (SUTURE) ×1 IMPLANT
SYR CONTROL 10ML LL (SYRINGE) ×2 IMPLANT
TOWEL OR 17X24 6PK STRL BLUE (TOWEL DISPOSABLE) ×4 IMPLANT
TRAY DSU PREP LF (CUSTOM PROCEDURE TRAY) ×2 IMPLANT
TUBE CONNECTING 12X1/4 (SUCTIONS) ×1 IMPLANT
WATER STERILE IRR 500ML POUR (IV SOLUTION) ×1 IMPLANT
YANKAUER SUCT BULB TIP NO VENT (SUCTIONS) ×1 IMPLANT

## 2013-12-01 NOTE — Discharge Instructions (Addendum)
Warm H2O sitz bath BID>>>use Dermoplast spray after prn Post Anesthesia Home Care Instructions  Activity: Get plenty of rest for the remainder of the day. A responsible adult should stay with you for 24 hours following the procedure.  For the next 24 hours, DO NOT: -Drive a car -Advertising copywriterperate machinery -Drink alcoholic beverages -Take any medication unless instructed by your physician -Make any legal decisions or sign important papers.  Meals: Start with liquid foods such as gelatin or soup. Progress to regular foods as tolerated. Avoid greasy, spicy, heavy foods. If nausea and/or vomiting occur, drink only clear liquids until the nausea and/or vomiting subsides. Call your physician if vomiting continues.  Special Instructions/Symptoms: Your throat may feel dry or sore from the anesthesia or the breathing tube placed in your throat during surgery. If this causes discomfort, gargle with warm salt water. The discomfort should disappear within 24 hours. Information for Discharge Teaching: EXPAREL (bupivacaine liposome injectable suspension)   Your surgeon gave you EXPAREL(bupivacaine) in your surgical incision to help control your pain after surgery.   EXPAREL is a local anesthetic that provides pain relief by numbing the tissue around the surgical site.  EXPAREL is designed to release pain medication over time and can control pain for up to 72 hours.  Depending on how you respond to EXPAREL, you may require less pain medication during your recovery.  Possible side effects:  Temporary loss of sensation or ability to move in the area where bupivacaine was injected.  Nausea, vomiting, constipation  Rarely, numbness and tingling in your mouth or lips, lightheadedness, or anxiety may occur.  Call your doctor right away if you think you may be experiencing any of these sensations, or if you have other questions regarding possible side effects.  Follow all other discharge instructions given to  you by your surgeon or nurse. Eat a healthy diet and drink plenty of water or other fluids.  If you return to the hospital for any reason within 96 hours following the administration of EXPAREL, please inform your health care providers.Call your surgeon if you experience:   1.  Fever over 101.0. 2.  Inability to urinate. 3.  Nausea and/or vomiting. 4.  Extreme swelling or bruising at the surgical site. 5.  Continued bleeding from the incision. 6.  Increased pain, redness or drainage from the incision. 7.  Problems related to your pain medication. 8. Any problems and/or concerns

## 2013-12-01 NOTE — Op Note (Signed)
Preoperative diagnosis: Insertional dyspareunia, redundant labia minora  Postoperative diagnosis: Same  Procedure: Reduction labioplasty  Surgeon: Marcelle OverlieHolland  Anesthesia: Gen., LMA  EBL: Less than 20 cc  Procedure and findings:  The patient was taken to the operating room after an adequate level of general anesthesia was obtained with the legs in stirrups the perineum and vagina were prepped. Appropriate timeouts were taken at that point. We previously outlined and demonstrated to the patient the line of excision for the labia minora. The labia was placed on traction with Allis clamps and small dissecting scissors were then used to dissect the redundant portion of scan. This was done on the opposite side to make them equal. There was minimal bleeding 4-0 Monocryl running suture was then used to reapproximate the skin edges this was hemostatic. Exparel was then injected into the operative site for postoperative analgesia.. She did receive IV Tylenol and Toradol at end of procedure went to recovery room in good condition.  Dictated with [facility]  Emelda Kohlbeck Milana ObeyM Quincy Boy M.D.

## 2013-12-01 NOTE — Transfer of Care (Signed)
Immediate Anesthesia Transfer of Care Note  Patient: Tonya Kramer  Procedure(s) Performed: Procedure(s): LABIAPLASTY (Bilateral)  Patient Location: PACU  Anesthesia Type:General  Level of Consciousness: sedated and responds to stimulation  Airway & Oxygen Therapy: Patient Spontanous Breathing and Patient connected to face mask oxygen  Post-op Assessment: Report given to PACU RN  Post vital signs: Reviewed and stable  Complications: No apparent anesthesia complications

## 2013-12-01 NOTE — Progress Notes (Signed)
The patient was re-examined with no change in status 

## 2013-12-01 NOTE — Anesthesia Procedure Notes (Signed)
Procedure Name: LMA Insertion Date/Time: 12/01/2013 7:38 AM Performed by: Maris BergerENENNY, Demika Langenderfer T Pre-anesthesia Checklist: Patient identified, Emergency Drugs available, Suction available and Patient being monitored Patient Re-evaluated:Patient Re-evaluated prior to inductionOxygen Delivery Method: Circle System Utilized Preoxygenation: Pre-oxygenation with 100% oxygen Intubation Type: IV induction Ventilation: Mask ventilation without difficulty LMA: LMA inserted LMA Size: 3.0 Number of attempts: 1 Airway Equipment and Method: bite block Placement Confirmation: positive ETCO2 Dental Injury: Teeth and Oropharynx as per pre-operative assessment

## 2013-12-01 NOTE — Anesthesia Postprocedure Evaluation (Signed)
  Anesthesia Post-op Note  Patient: Tonya Kramer  Procedure(s) Performed: Procedure(s) (LRB): LABIAPLASTY (Bilateral)  Patient Location: PACU  Anesthesia Type: General  Level of Consciousness: awake and alert   Airway and Oxygen Therapy: Patient Spontanous Breathing  Post-op Pain: mild  Post-op Assessment: Post-op Vital signs reviewed, Patient's Cardiovascular Status Stable, Respiratory Function Stable, Patent Airway and No signs of Nausea or vomiting  Last Vitals:  Filed Vitals:   12/01/13 0812  BP: 88/51  Pulse: 70  Temp: 36.6 C  Resp: 14    Post-op Vital Signs: stable   Complications: No apparent anesthesia complications

## 2013-12-04 ENCOUNTER — Encounter (HOSPITAL_BASED_OUTPATIENT_CLINIC_OR_DEPARTMENT_OTHER): Payer: Self-pay | Admitting: Obstetrics and Gynecology

## 2014-03-12 ENCOUNTER — Emergency Department (HOSPITAL_BASED_OUTPATIENT_CLINIC_OR_DEPARTMENT_OTHER)
Admission: EM | Admit: 2014-03-12 | Discharge: 2014-03-12 | Disposition: A | Discharge status: Home / Self Care | Payer: 59 | Attending: Emergency Medicine | Admitting: Emergency Medicine

## 2014-03-12 ENCOUNTER — Emergency Department (HOSPITAL_BASED_OUTPATIENT_CLINIC_OR_DEPARTMENT_OTHER): Payer: 59

## 2014-03-12 ENCOUNTER — Encounter (HOSPITAL_BASED_OUTPATIENT_CLINIC_OR_DEPARTMENT_OTHER): Payer: Self-pay | Admitting: *Deleted

## 2014-03-12 DIAGNOSIS — Z8709 Personal history of other diseases of the respiratory system: Secondary | ICD-10-CM | POA: Diagnosis not present

## 2014-03-12 DIAGNOSIS — Z3202 Encounter for pregnancy test, result negative: Secondary | ICD-10-CM | POA: Insufficient documentation

## 2014-03-12 DIAGNOSIS — R110 Nausea: Secondary | ICD-10-CM | POA: Insufficient documentation

## 2014-03-12 DIAGNOSIS — R1031 Right lower quadrant pain: Secondary | ICD-10-CM | POA: Diagnosis not present

## 2014-03-12 DIAGNOSIS — Z8742 Personal history of other diseases of the female genital tract: Secondary | ICD-10-CM | POA: Diagnosis not present

## 2014-03-12 DIAGNOSIS — Z87891 Personal history of nicotine dependence: Secondary | ICD-10-CM | POA: Insufficient documentation

## 2014-03-12 DIAGNOSIS — Z9889 Other specified postprocedural states: Secondary | ICD-10-CM | POA: Insufficient documentation

## 2014-03-12 DIAGNOSIS — Z973 Presence of spectacles and contact lenses: Secondary | ICD-10-CM | POA: Insufficient documentation

## 2014-03-12 LAB — BASIC METABOLIC PANEL
Anion gap: 2 — ABNORMAL LOW (ref 5–15)
BUN: 9 mg/dL (ref 6–23)
CHLORIDE: 107 mmol/L (ref 96–112)
CO2: 29 mmol/L (ref 19–32)
Calcium: 9.1 mg/dL (ref 8.4–10.5)
Creatinine, Ser: 0.76 mg/dL (ref 0.50–1.10)
GFR calc non Af Amer: >90 mL/min (ref >90)
Glucose, Bld: 100 mg/dL — ABNORMAL HIGH (ref 70–99)
Potassium: 3.9 mmol/L (ref 3.5–5.1)
Sodium: 138 mmol/L (ref 135–145)

## 2014-03-12 LAB — URINALYSIS, ROUTINE W REFLEX MICROSCOPIC
Bilirubin Urine: NEGATIVE
GLUCOSE, UA: NEGATIVE mg/dL
Hgb urine dipstick: NEGATIVE
Ketones, ur: NEGATIVE mg/dL
LEUKOCYTES UA: NEGATIVE
NITRITE: NEGATIVE
PROTEIN: NEGATIVE mg/dL
SPECIFIC GRAVITY, URINE: 1.012 (ref 1.005–1.030)
UROBILINOGEN UA: 1 mg/dL (ref 0.0–1.0)
pH: 6 (ref 5.0–8.0)

## 2014-03-12 LAB — CBC WITH DIFFERENTIAL/PLATELET
BASOS ABS: 0 10*3/uL (ref 0.0–0.1)
Basophils Relative: 0 % (ref 0–1)
Eosinophils Absolute: 0.1 10*3/uL (ref 0.0–0.7)
Eosinophils Relative: 1 % (ref 0–5)
HCT: 41.2 % (ref 36.0–46.0)
Hemoglobin: 14.3 g/dL (ref 12.0–15.0)
LYMPHS ABS: 2.9 10*3/uL (ref 0.7–4.0)
LYMPHS PCT: 19 % (ref 12–46)
MCH: 30.6 pg (ref 26.0–34.0)
MCHC: 34.7 g/dL (ref 30.0–36.0)
MCV: 88 fL (ref 78.0–100.0)
MONOS PCT: 6 % (ref 3–12)
Monocytes Absolute: 0.9 10*3/uL (ref 0.1–1.0)
NEUTROS ABS: 11.4 10*3/uL — AB (ref 1.7–7.7)
NEUTROS PCT: 74 % (ref 43–77)
PLATELETS: 198 10*3/uL (ref 150–400)
RBC: 4.68 MIL/uL (ref 3.87–5.11)
RDW: 12.1 % (ref 11.5–15.5)
WBC: 15.3 10*3/uL — ABNORMAL HIGH (ref 4.0–10.5)

## 2014-03-12 LAB — PREGNANCY, URINE: Preg Test, Ur: NEGATIVE

## 2014-03-12 MED ORDER — IOHEXOL 300 MG/ML  SOLN
50.0000 mL | Freq: Once | INTRAMUSCULAR | Status: AC | PRN
  Administered 2014-03-12: 50 mL via ORAL

## 2014-03-12 MED ORDER — HYDROCODONE-ACETAMINOPHEN 5-325 MG PO TABS: 1.0000 | ORAL_TABLET | Freq: Four times a day (QID) | ORAL | Status: DC | PRN

## 2014-03-12 MED ORDER — IOHEXOL 300 MG/ML  SOLN
80.0000 mL | Freq: Once | INTRAMUSCULAR | Status: AC | PRN
  Administered 2014-03-12: 80 mL via INTRAVENOUS

## 2014-03-12 NOTE — Discharge Instructions (Signed)
Hydrocodone as prescribed as needed for pain.  Your CT scan does not show appendicitis, however this does not entirely exclude this possibility. Be sure to return to the emergency room if your pain worsens, you develop high fever, bloody stool, or other new and concerning symptoms.   Abdominal Pain, Women Abdominal (stomach, pelvic, or belly) pain can be caused by many things. It is important to tell your doctor:  The location of the pain.  Does it come and go or is it present all the time?  Are there things that start the pain (eating certain foods, exercise)?  Are there other symptoms associated with the pain (fever, nausea, vomiting, diarrhea)? All of this is helpful to know when trying to find the cause of the pain. CAUSES   Stomach: virus or bacteria infection, or ulcer.  Intestine: appendicitis (inflamed appendix), regional ileitis (Crohn's disease), ulcerative colitis (inflamed colon), irritable bowel syndrome, diverticulitis (inflamed diverticulum of the colon), or cancer of the stomach or intestine.  Gallbladder disease or stones in the gallbladder.  Kidney disease, kidney stones, or infection.  Pancreas infection or cancer.  Fibromyalgia (pain disorder).  Diseases of the female organs:  Uterus: fibroid (non-cancerous) tumors or infection.  Fallopian tubes: infection or tubal pregnancy.  Ovary: cysts or tumors.  Pelvic adhesions (scar tissue).  Endometriosis (uterus lining tissue growing in the pelvis and on the pelvic organs).  Pelvic congestion syndrome (female organs filling up with blood just before the menstrual period).  Pain with the menstrual period.  Pain with ovulation (producing an egg).  Pain with an IUD (intrauterine device, birth control) in the uterus.  Cancer of the female organs.  Functional pain (pain not caused by a disease, may improve without treatment).  Psychological pain.  Depression. DIAGNOSIS  Your doctor will decide the  seriousness of your pain by doing an examination.  Blood tests.  X-rays.  Ultrasound.  CT scan (computed tomography, special type of X-ray).  MRI (magnetic resonance imaging).  Cultures, for infection.  Barium enema (dye inserted in the large intestine, to better view it with X-rays).  Colonoscopy (looking in intestine with a lighted tube).  Laparoscopy (minor surgery, looking in abdomen with a lighted tube).  Major abdominal exploratory surgery (looking in abdomen with a large incision). TREATMENT  The treatment will depend on the cause of the pain.   Many cases can be observed and treated at home.  Over-the-counter medicines recommended by your caregiver.  Prescription medicine.  Antibiotics, for infection.  Birth control pills, for painful periods or for ovulation pain.  Hormone treatment, for endometriosis.  Nerve blocking injections.  Physical therapy.  Antidepressants.  Counseling with a psychologist or psychiatrist.  Minor or major surgery. HOME CARE INSTRUCTIONS   Do not take laxatives, unless directed by your caregiver.  Take over-the-counter pain medicine only if ordered by your caregiver. Do not take aspirin because it can cause an upset stomach or bleeding.  Try a clear liquid diet (broth or water) as ordered by your caregiver. Slowly move to a bland diet, as tolerated, if the pain is related to the stomach or intestine.  Have a thermometer and take your temperature several times a day, and record it.  Bed rest and sleep, if it helps the pain.  Avoid sexual intercourse, if it causes pain.  Avoid stressful situations.  Keep your follow-up appointments and tests, as your caregiver orders.  If the pain does not go away with medicine or surgery, you may try:  Acupuncture.  Relaxation exercises (yoga, meditation).  Group therapy.  Counseling. SEEK MEDICAL CARE IF:   You notice certain foods cause stomach pain.  Your home care  treatment is not helping your pain.  You need stronger pain medicine.  You want your IUD removed.  You feel faint or lightheaded.  You develop nausea and vomiting.  You develop a rash.  You are having side effects or an allergy to your medicine. SEEK IMMEDIATE MEDICAL CARE IF:   Your pain does not go away or gets worse.  You have a fever.  Your pain is felt only in portions of the abdomen. The right side could possibly be appendicitis. The left lower portion of the abdomen could be colitis or diverticulitis.  You are passing blood in your stools (bright red or black tarry stools, with or without vomiting).  You have blood in your urine.  You develop chills, with or without a fever.  You pass out. MAKE SURE YOU:   Understand these instructions.  Will watch your condition.  Will get help right away if you are not doing well or get worse. Document Released: 11/09/2006 Document Revised: 05/29/2013 Document Reviewed: 11/29/2008 [facility] Patient Information 2015 [place], [place]. This information is not intended to replace advice given to you by your health care provider. Make sure you discuss any questions you have with your health care provider.

## 2014-03-12 NOTE — ED Provider Notes (Signed)
CSN: [id number]     Arrival date & time 03/12/14  [id number] History  This chart was scribed for Geoffery Lyonsouglas Burkley Dech, MD by Abel PrestoKara Demonbreun, ED Scribe. This patient was seen in room MH02/MH02 and the patient's care was started at 8:01 PM.    Chief Complaint  Patient presents with  . Abdominal Pain     Patient is a 24 y.o. female presenting with abdominal pain. The history is provided by the patient. No language interpreter was used.  Abdominal Pain Associated symptoms: nausea   Associated symptoms: no constipation, no diarrhea, no dysuria, no vaginal bleeding, no vaginal discharge and no vomiting    HPI Comments: Tonya Kramer is a 24 y.o. female who presents to the Emergency Department complaining of sharp RLQ and suprapubic pain with gradual onset this afternoon. Pt notes pain began as abdominal discomfort this morning, moving to right side with associated nausea.  Pt's last normal bowel movement was this morning. Pt's LNMP was 02/04. Pt is a mother of 2 children.  Pt with h/o inguinal hernia repair and labioplasty. Pt denies vaginal discharge or bleeding, constipation, vomiting, diarrhea, and dysuria.   Past Medical History  Diagnosis Date  . Dyspareunia, female   . PONV (postoperative nausea and vomiting)   . Family history of adverse reaction to anesthesia     mother-  ponv  . Throat irritation     itching  . Cough   . Wears contact lenses    Past Surgical History  Procedure Laterality Date  . Tonsillectomy and adenoidectomy  age 24  . Inguinal hernia repair Bilateral age 89  . Labioplasty Bilateral 12/01/2013    Procedure: LABIAPLASTY;  Surgeon: Meriel Picaichard M Holland, MD;  [facility];  Service: Gynecology;  Laterality: Bilateral;   Family History  Problem Relation Age of Onset  . Hyperlipidemia Mother   . Fibromyalgia Mother   . Irritable bowel syndrome Mother   . Cancer Mother     skin cancer on face  . Arthritis Mother   . Tuberculosis Mother     carrier  .  Hypertension Father   . Asthma Sister   . Cancer Brother     cancer on rib  . Other Brother     heart beat slow  . Arthritis Maternal Grandmother     hands and feet  . Hypertension Maternal Grandfather   . Heart disease Maternal Grandfather   . Cancer Maternal Grandfather     prostate  . COPD Maternal Grandfather     smoker  . Emphysema Maternal Grandfather     smoker  . Migraines Paternal Grandmother   . Depression Paternal Grandmother   . Other Paternal Grandmother     hip replacement  . Arthritis Paternal Grandfather   . Drug abuse Paternal Grandfather   . Asthma Sister    History  Substance Use Topics  . Smoking status: Former Smoker -- 0.50 packs/day for 5 years    Types: Cigarettes    Quit date: 06/30/2012  . Smokeless tobacco: Never Used  . Alcohol Use: No   OB History    Gravida Para Term Preterm AB TAB SAB Ectopic Multiple Living   2 2 2       2      Review of Systems  Gastrointestinal: Positive for nausea and abdominal pain. Negative for vomiting, diarrhea and constipation.  Genitourinary: Negative for dysuria, vaginal bleeding and vaginal discharge.  All other systems reviewed and are negative.     Allergies  Review of patient's allergies indicates no known allergies.  Home Medications   Prior to Admission medications   Medication Sig Start Date End Date Taking? Authorizing Provider  HYDROcodone-ibuprofen (VICOPROFEN) 7.5-200 MG per tablet Take 1 tablet by mouth every 8 (eight) hours as needed for moderate pain. 12/01/13   Meriel Pica, MD   BP 109/68 mmHg  Pulse 87  Temp(Src) 98.9 F (37.2 C) (Oral)  Resp 20  Ht  (1.575 m)  Wt 108 lb (48.988 kg)  BMI 19.75 kg/m2  SpO2 100%  LMP 03/01/2014 Physical Exam  Constitutional: She is oriented to person, place, and time. She appears well-developed and well-nourished.  HENT:  Head: Normocephalic.  Eyes: Conjunctivae are normal.  Neck: Normal range of motion. Neck supple.   Pulmonary/Chest: Effort normal.  Abdominal: There is tenderness in the right lower quadrant and suprapubic area. There is no rebound and no guarding.  Musculoskeletal: Normal range of motion.  Neurological: She is alert and oriented to person, place, and time.  Skin: Skin is warm and dry.  Psychiatric: She has a normal mood and affect. Her behavior is normal.  Nursing note and vitals reviewed.   ED Course  Procedures (including critical care time) DIAGNOSTIC STUDIES: Oxygen Saturation is 100% on room air, normal by my interpretation.    COORDINATION OF CARE: 8:05 PM Discussed treatment plan with patient at beside, the patient agrees with the plan and has no further questions at this time.   Labs Review Labs Reviewed  BASIC METABOLIC PANEL - Abnormal; Notable for the following:    Glucose, Bld 100 (*)    Anion gap 2 (*)    All other components within normal limits  CBC WITH DIFFERENTIAL/PLATELET - Abnormal; Notable for the following:    WBC 15.3 (*)    Neutro Abs 11.4 (*)    All other components within normal limits  URINALYSIS, ROUTINE W REFLEX MICROSCOPIC  PREGNANCY, URINE    Imaging Review No results found.   EKG Interpretation None      Results for orders placed or performed during the hospital encounter of 03/12/14  Urinalysis, Routine w reflex microscopic  Result Value Ref Range   Color, Urine YELLOW YELLOW   APPearance CLEAR CLEAR   Specific Gravity, Urine 1.012 1.005 - 1.030   pH 6.0 5.0 - 8.0   Glucose, UA NEGATIVE NEGATIVE mg/dL   Hgb urine dipstick NEGATIVE NEGATIVE   Bilirubin Urine NEGATIVE NEGATIVE   Ketones, ur NEGATIVE NEGATIVE mg/dL   Protein, ur NEGATIVE NEGATIVE mg/dL   Urobilinogen, UA 1.0 0.0 - 1.0 mg/dL   Nitrite NEGATIVE NEGATIVE   Leukocytes, UA NEGATIVE NEGATIVE  Pregnancy, urine  Result Value Ref Range   Preg Test, Ur NEGATIVE NEGATIVE  Basic metabolic panel  Result Value Ref Range   Sodium 138 135 - 145 mmol/L   Potassium 3.9 3.5  - 5.1 mmol/L   Chloride 107 96 - 112 mmol/L   CO2 29 19 - 32 mmol/L   Glucose, Bld 100 (H) 70 - 99 mg/dL   BUN 9 6 - 23 mg/dL   Creatinine, Ser 1.61 0.50 - 1.10 mg/dL   Calcium 9.1 8.4 - 09.6 mg/dL   GFR calc non Af Amer >90 >90 mL/min   GFR calc Af Amer >90 >90 mL/min   Anion gap 2 (L) 5 - 15  CBC with Differential  Result Value Ref Range   WBC 15.3 (H) 4.0 - 10.5 K/uL   RBC 4.68 3.87 - 5.11 MIL/uL  Hemoglobin 14.3 12.0 - 15.0 g/dL   HCT 04.5 40.9 - 81.1 %   MCV 88.0 78.0 - 100.0 fL   MCH 30.6 26.0 - 34.0 pg   MCHC 34.7 30.0 - 36.0 g/dL   RDW 91.4 78.2 - 95.6 %   Platelets 198 150 - 400 K/uL   Neutrophils Relative % 74 43 - 77 %   Neutro Abs 11.4 (H) 1.7 - 7.7 K/uL   Lymphocytes Relative 19 12 - 46 %   Lymphs Abs 2.9 0.7 - 4.0 K/uL   Monocytes Relative 6 3 - 12 %   Monocytes Absolute 0.9 0.1 - 1.0 K/uL   Eosinophils Relative 1 0 - 5 %   Eosinophils Absolute 0.1 0.0 - 0.7 K/uL   Basophils Relative 0 0 - 1 %   Basophils Absolute 0.0 0.0 - 0.1 K/uL   No results found.    MDM   Final diagnoses:  None    Patient presents with right lower quadrant abdominal pain. She is tender in the suprapubic region and right lower quadrant, however there is no rebound or guarding. Workup reveals a white count of 15,000, however CT scan fails to reveal appendicitis or other acute intra-abdominal pathology. Patient is feeling better and will be discharged to home. I will prescribe a small quantity pain medication she can take in the meantime. She is to return as needed if her symptoms substantially worsen or change.   I personally performed the services described in this documentation, which was scribed in my presence. The recorded information has been reviewed and is accurate.      Geoffery Lyons, MD 03/12/14 2329

## 2014-03-12 NOTE — ED Notes (Signed)
Right lower abdominal pain.

## 2014-03-12 NOTE — ED Notes (Signed)
MD at bedside. 

## 2014-03-21 ENCOUNTER — Encounter: Payer: Self-pay | Admitting: Nurse Practitioner

## 2014-03-21 ENCOUNTER — Ambulatory Visit (INDEPENDENT_AMBULATORY_CARE_PROVIDER_SITE_OTHER): Payer: 59 | Admitting: Nurse Practitioner

## 2014-03-21 ENCOUNTER — Emergency Department (HOSPITAL_BASED_OUTPATIENT_CLINIC_OR_DEPARTMENT_OTHER)
Admission: EM | Admit: 2014-03-21 | Discharge: 2014-03-21 | Disposition: A | Discharge status: Home / Self Care | Payer: 59 | Attending: Emergency Medicine | Admitting: Emergency Medicine

## 2014-03-21 ENCOUNTER — Encounter (HOSPITAL_BASED_OUTPATIENT_CLINIC_OR_DEPARTMENT_OTHER): Payer: Self-pay | Admitting: *Deleted

## 2014-03-21 VITALS — BP 94/60 | HR 68 | Temp 97.3°F | Ht 63.0 in | Wt 105.0 lb

## 2014-03-21 DIAGNOSIS — K529 Noninfective gastroenteritis and colitis, unspecified: Secondary | ICD-10-CM | POA: Diagnosis not present

## 2014-03-21 DIAGNOSIS — Z3202 Encounter for pregnancy test, result negative: Secondary | ICD-10-CM | POA: Insufficient documentation

## 2014-03-21 DIAGNOSIS — N898 Other specified noninflammatory disorders of vagina: Secondary | ICD-10-CM | POA: Insufficient documentation

## 2014-03-21 DIAGNOSIS — Z8709 Personal history of other diseases of the respiratory system: Secondary | ICD-10-CM | POA: Diagnosis not present

## 2014-03-21 DIAGNOSIS — R197 Diarrhea, unspecified: Secondary | ICD-10-CM

## 2014-03-21 DIAGNOSIS — R1084 Generalized abdominal pain: Secondary | ICD-10-CM | POA: Diagnosis present

## 2014-03-21 DIAGNOSIS — Z87891 Personal history of nicotine dependence: Secondary | ICD-10-CM | POA: Diagnosis not present

## 2014-03-21 DIAGNOSIS — R112 Nausea with vomiting, unspecified: Secondary | ICD-10-CM

## 2014-03-21 DIAGNOSIS — R1030 Lower abdominal pain, unspecified: Secondary | ICD-10-CM | POA: Insufficient documentation

## 2014-03-21 LAB — COMPREHENSIVE METABOLIC PANEL
ALBUMIN: 4.9 g/dL (ref 3.5–5.2)
ALT: 19 U/L (ref 0–35)
AST: 33 U/L (ref 0–37)
Alkaline Phosphatase: 52 U/L (ref 39–117)
Anion gap: 1 — ABNORMAL LOW (ref 5–15)
BUN: 11 mg/dL (ref 6–23)
CHLORIDE: 107 mmol/L (ref 96–112)
CO2: 24 mmol/L (ref 19–32)
CREATININE: 0.77 mg/dL (ref 0.50–1.10)
Calcium: 8.8 mg/dL (ref 8.4–10.5)
GFR calc Af Amer: >90 mL/min (ref >90)
GFR calc non Af Amer: >90 mL/min (ref >90)
Glucose, Bld: 104 mg/dL — ABNORMAL HIGH (ref 70–99)
Potassium: 4.4 mmol/L (ref 3.5–5.1)
Sodium: 132 mmol/L — ABNORMAL LOW (ref 135–145)
Total Bilirubin: 0.4 mg/dL (ref 0.3–1.2)
Total Protein: 7.7 g/dL (ref 6.0–8.3)

## 2014-03-21 LAB — CBC WITH DIFFERENTIAL/PLATELET
BASOS ABS: 0 10*3/uL (ref 0.0–0.1)
BASOS PCT: 0 % (ref 0–1)
Eosinophils Absolute: 0 10*3/uL (ref 0.0–0.7)
Eosinophils Relative: 0 % (ref 0–5)
HCT: 40.4 % (ref 36.0–46.0)
HEMOGLOBIN: 14.3 g/dL (ref 12.0–15.0)
Lymphocytes Relative: 8 % — ABNORMAL LOW (ref 12–46)
Lymphs Abs: 1.1 10*3/uL (ref 0.7–4.0)
MCH: 30.9 pg (ref 26.0–34.0)
MCHC: 35.4 g/dL (ref 30.0–36.0)
MCV: 87.3 fL (ref 78.0–100.0)
Monocytes Absolute: 0.6 10*3/uL (ref 0.1–1.0)
Monocytes Relative: 4 % (ref 3–12)
NEUTROS ABS: 12.1 10*3/uL — AB (ref 1.7–7.7)
Neutrophils Relative %: 88 % — ABNORMAL HIGH (ref 43–77)
Platelets: 223 10*3/uL (ref 150–400)
RBC: 4.63 MIL/uL (ref 3.87–5.11)
RDW: 12.2 % (ref 11.5–15.5)
WBC: 13.9 10*3/uL — ABNORMAL HIGH (ref 4.0–10.5)

## 2014-03-21 LAB — URINE MICROSCOPIC-ADD ON

## 2014-03-21 LAB — URINALYSIS, ROUTINE W REFLEX MICROSCOPIC
Bilirubin Urine: NEGATIVE
Glucose, UA: NEGATIVE mg/dL
Hgb urine dipstick: NEGATIVE
Ketones, ur: NEGATIVE mg/dL
NITRITE: NEGATIVE
PH: 5 (ref 5.0–8.0)
Protein, ur: NEGATIVE mg/dL
SPECIFIC GRAVITY, URINE: 1.017 (ref 1.005–1.030)
Urobilinogen, UA: 0.2 mg/dL (ref 0.0–1.0)

## 2014-03-21 LAB — LIPASE, BLOOD: Lipase: 43 U/L (ref 11–59)

## 2014-03-21 LAB — WET PREP, GENITAL
CLUE CELLS WET PREP: NONE SEEN
TRICH WET PREP: NONE SEEN
Yeast Wet Prep HPF POC: NONE SEEN

## 2014-03-21 LAB — PREGNANCY, URINE: PREG TEST UR: NEGATIVE

## 2014-03-21 MED ORDER — ONDANSETRON HCL 4 MG/2ML IJ SOLN
4.0000 mg | Freq: Once | INTRAMUSCULAR | Status: AC
  Administered 2014-03-21: 4 mg via INTRAVENOUS
  Filled 2014-03-21: qty 2

## 2014-03-21 MED ORDER — MORPHINE SULFATE 4 MG/ML IJ SOLN
4.0000 mg | Freq: Once | INTRAMUSCULAR | Status: AC
  Administered 2014-03-21: 4 mg via INTRAVENOUS
  Filled 2014-03-21: qty 1

## 2014-03-21 MED ORDER — ONDANSETRON HCL 4 MG PO TABS: 4.0000 mg | ORAL_TABLET | Freq: Four times a day (QID) | ORAL | Status: DC

## 2014-03-21 MED ORDER — SODIUM CHLORIDE 0.9 % IV BOLUS (SEPSIS)
1000.0000 mL | Freq: Once | INTRAVENOUS | Status: AC
  Administered 2014-03-21: 1000 mL via INTRAVENOUS

## 2014-03-21 NOTE — Discharge Instructions (Signed)
Viral Gastroenteritis Encourage fluids at home. Take nausea medication as prescribed. Return to the ED develop worsening pain, vomiting, fever or any other concerns. Viral gastroenteritis is also known as stomach flu. This condition affects the stomach and intestinal tract. It can cause sudden diarrhea and vomiting. The illness typically lasts 3 to 8 days. Most people develop an immune response that eventually gets rid of the virus. While this natural response develops, the virus can make you quite ill. CAUSES  Many different viruses can cause gastroenteritis, such as rotavirus or noroviruses. You can catch one of these viruses by consuming contaminated food or water. You may also catch a virus by sharing utensils or other personal items with an infected person or by touching a contaminated surface. SYMPTOMS  The most common symptoms are diarrhea and vomiting. These problems can cause a severe loss of body fluids (dehydration) and a body salt (electrolyte) imbalance. Other symptoms may include:  Fever.  Headache.  Fatigue.  Abdominal pain. DIAGNOSIS  Your caregiver can usually diagnose viral gastroenteritis based on your symptoms and a physical exam. A stool sample may also be taken to test for the presence of viruses or other infections. TREATMENT  This illness typically goes away on its own. Treatments are aimed at rehydration. The most serious cases of viral gastroenteritis involve vomiting so severely that you are not able to keep fluids down. In these cases, fluids must be given through an intravenous line (IV). HOME CARE INSTRUCTIONS   Drink enough fluids to keep your urine clear or pale yellow. Drink small amounts of fluids frequently and increase the amounts as tolerated.  Ask your caregiver for specific rehydration instructions.  Avoid:  Foods high in sugar.  Alcohol.  Carbonated drinks.  Tobacco.  Juice.  Caffeine drinks.  Extremely hot or cold fluids.  Fatty,  greasy foods.  Too much intake of anything at one time.  Dairy products until 24 to 48 hours after diarrhea stops.  You may consume probiotics. Probiotics are active cultures of beneficial bacteria. They may lessen the amount and number of diarrheal stools in adults. Probiotics can be found in yogurt with active cultures and in supplements.  Wash your hands well to avoid spreading the virus.  Only take over-the-counter or prescription medicines for pain, discomfort, or fever as directed by your caregiver. Do not give aspirin to children. Antidiarrheal medicines are not recommended.  Ask your caregiver if you should continue to take your regular prescribed and over-the-counter medicines.  Keep all follow-up appointments as directed by your caregiver. SEEK IMMEDIATE MEDICAL CARE IF:   You are unable to keep fluids down.  You do not urinate at least once every 6 to 8 hours.  You develop shortness of breath.  You notice blood in your stool or vomit. This may look like coffee grounds.  You have abdominal pain that increases or is concentrated in one small area (localized).  You have persistent vomiting or diarrhea.  You have a fever.  The patient is a child younger than 3 months, and he or she has a fever.  The patient is a child older than 3 months, and he or she has a fever and persistent symptoms.  The patient is a child older than 3 months, and he or she has a fever and symptoms suddenly get worse.  The patient is a baby, and he or she has no tears when crying. MAKE SURE YOU:   Understand these instructions.  Will watch your condition.  Will  get help right away if you are not doing well or get worse. Document Released: 01/12/2005 Document Revised: 04/06/2011 Document Reviewed: 10/29/2010 [facility] Patient Information 2015 [employer] [place]. This information is not intended to replace advice given to you by your health care provider. Make sure you discuss any questions you  have with your health care provider.

## 2014-03-21 NOTE — ED Provider Notes (Signed)
CSN: [id number]     Arrival date & time 03/21/14  1430 History   First MD Initiated Contact with Patient 03/21/14 1603     Chief Complaint  Patient presents with  . Abdominal Pain     (Consider location/radiation/quality/duration/timing/severity/associated sxs/prior Treatment) HPI Comments: Patient presents with lower abdominal cramping, diarrhea, vomiting and nausea that onset last night. She states she woke up with multiple boluses of diarrhea throughout the night that are nonbloody. She has diffuse lower abdominal cramping and multiple episodes of nausea and vomiting today. Denies fever. Last menstrual cycle was February 4. She denies any dysuria hematuria. No vaginal bleeding or discharge. Denies sick contacts. Denies recent travel or antibiotic use. She was seen by her PCP and referred to the ED. She was seen in the ED 9 days ago for abdominal pain and had a negative CT looking for appendicitis. She says this pain is different.  The history is provided by the patient.    Past Medical History  Diagnosis Date  . Dyspareunia, female   . PONV (postoperative nausea and vomiting)   . Family history of adverse reaction to anesthesia     mother-  ponv  . Throat irritation     itching  . Cough   . Wears contact lenses    Past Surgical History  Procedure Laterality Date  . Tonsillectomy and adenoidectomy  age 70  . Inguinal hernia repair Bilateral age 51  . Labioplasty Bilateral 12/01/2013    Procedure: LABIAPLASTY;  Surgeon: Meriel Pica, MD;  Location: [facility];  Service: Gynecology;  Laterality: Bilateral;   Family History  Problem Relation Age of Onset  . Hyperlipidemia Mother   . Fibromyalgia Mother   . Irritable bowel syndrome Mother   . Cancer Mother     skin cancer on face  . Arthritis Mother   . Tuberculosis Mother     carrier  . Hypertension Father   . Asthma Sister   . Cancer Brother     cancer on rib  . Other Brother     heart beat slow  .  Arthritis Maternal Grandmother     hands and feet  . Hypertension Maternal Grandfather   . Heart disease Maternal Grandfather   . Cancer Maternal Grandfather     prostate  . COPD Maternal Grandfather     smoker  . Emphysema Maternal Grandfather     smoker  . Migraines Paternal Grandmother   . Depression Paternal Grandmother   . Other Paternal Grandmother     hip replacement  . Arthritis Paternal Grandfather   . Drug abuse Paternal Grandfather   . Asthma Sister    History  Substance Use Topics  . Smoking status: Former Smoker -- 0.50 packs/day for 5 years    Types: Cigarettes    Quit date: 06/30/2012  . Smokeless tobacco: Never Used  . Alcohol Use: No   OB History    Gravida Para Term Preterm AB TAB SAB Ectopic Multiple Living   Review of Systems  Constitutional: Positive for activity change and appetite change. Negative for fever.  HENT: Negative for congestion and rhinorrhea.   Respiratory: Negative for cough, chest tightness and shortness of breath.   Cardiovascular: Negative for chest pain.  Gastrointestinal: Positive for nausea, vomiting, abdominal pain and diarrhea.  Genitourinary: Negative for dysuria, hematuria, vaginal bleeding and vaginal discharge.  Musculoskeletal: Negative for myalgias, back pain and  arthralgias.  Skin: Negative for rash.  Neurological: Negative for dizziness, weakness and headaches.  A complete 10 system review of systems was obtained and all systems are negative except as noted in the HPI and PMH.      Allergies  Review of patient's allergies indicates no known allergies.  Home Medications   Prior to Admission medications   Medication Sig Start Date End Date Taking? Authorizing Provider  HYDROcodone-acetaminophen (NORCO) 5-325 MG per tablet Take 1-2 tablets by mouth every 6 (six) hours as needed. Patient not taking: Reported on 03/21/2014 03/12/14   Geoffery Lyons, MD  HYDROcodone-ibuprofen (VICOPROFEN) 7.5-200 MG per  tablet Take 1 tablet by mouth every 8 (eight) hours as needed for moderate pain. Patient not taking: Reported on 03/21/2014 12/01/13   Meriel Pica, MD  ondansetron (ZOFRAN) 4 MG tablet Take 1 tablet (4 mg total) by mouth every 6 (six) hours. 03/21/14   Glynn Octave, MD   BP 95/57 mmHg  Pulse 70  Temp(Src) 98.4 F (36.9 C) (Oral)  Resp 16  SpO2 100%  LMP 03/01/2014 Physical Exam  Constitutional: She is oriented to person, place, and time. She appears well-developed and well-nourished. No distress.  HENT:  Head: Normocephalic and atraumatic.  Mouth/Throat: Oropharynx is clear and moist. No oropharyngeal exudate.  Eyes: Conjunctivae and EOM are normal. Pupils are equal, round, and reactive to light.  Neck: Normal range of motion. Neck supple.  No meningismus.  Cardiovascular: Normal rate, regular rhythm, normal heart sounds and intact distal pulses.   No murmur heard. Pulmonary/Chest: Effort normal and breath sounds normal. No respiratory distress.  Abdominal: Soft. There is tenderness. There is no rebound and no guarding.  TTP suprapubic and LLQ without guarding. No right lower quadrant pain  Genitourinary:  Normal external genitalia. No CMT. No adnexal tenderness. Scant white vaginal discharge. Chaperone present.  Musculoskeletal: Normal range of motion. She exhibits no edema or tenderness.  No CVA tenderness  Neurological: She is alert and oriented to person, place, and time. No cranial nerve deficit. She exhibits normal muscle tone. Coordination normal.  No ataxia on finger to nose bilaterally. No pronator drift. 5/5 strength throughout. CN 2-12 intact. Negative Romberg. Equal grip strength. Sensation intact. Gait is normal.   Skin: Skin is warm.  Psychiatric: She has a normal mood and affect. Her behavior is normal.  Nursing note and vitals reviewed.   ED Course  Procedures (including critical care time) Labs Review Labs Reviewed  WET PREP, GENITAL - Abnormal; Notable  for the following:    WBC, Wet Prep HPF POC FEW (*)    All other components within normal limits  URINALYSIS, ROUTINE W REFLEX MICROSCOPIC - Abnormal; Notable for the following:    Leukocytes, UA TRACE (*)    All other components within normal limits  CBC WITH DIFFERENTIAL/PLATELET - Abnormal; Notable for the following:    WBC 13.9 (*)    Neutrophils Relative % 88 (*)    Neutro Abs 12.1 (*)    Lymphocytes Relative 8 (*)    All other components within normal limits  COMPREHENSIVE METABOLIC PANEL - Abnormal; Notable for the following:    Sodium 132 (*)    Glucose, Bld 104 (*)    Anion gap 1 (*)    All other components within normal limits  URINE MICROSCOPIC-ADD ON - Abnormal; Notable for the following:    Squamous Epithelial / LPF FEW (*)    Bacteria, UA FEW (*)    All other components within normal  limits  PREGNANCY, URINE  LIPASE, BLOOD  GC/CHLAMYDIA PROBE AMP ([place])    Imaging Review No results found.   EKG Interpretation None      MDM   Final diagnoses:  Gastroenteritis   Lower abdominal pain with nausea, vomiting and diarrhea.  urinalysis negative. HCG negative.  Labs reassuring. Leukocytosis improving from previous. Pelvic exam benign. Patient feels improved.  after IV fluids and antiemetics in the ED. No vomiting or diarrhea in the ED. will By mouth challenge.  Patient tolerating by mouth. Pelvic exam benign. Abdomen is soft and nonsurgical. No right lower quadrant tenderness. CT from last week reviewed. Doubt appendicitis or cholecystitis.  Supportive care at home for likely viral gastroenteritis. We'll give antiemetics. Follow-up with PCP. Return cautious discussed.  Glynn OctaveStephen Yashika Mask, MD 03/22/14 Moses [email or web address]

## 2014-03-21 NOTE — Patient Instructions (Signed)
Please go to Er for further eval.

## 2014-03-21 NOTE — Progress Notes (Signed)
Pre visit review using our clinic review tool, if applicable. No additional management support is needed unless otherwise documented below in the visit note. 

## 2014-03-21 NOTE — ED Notes (Addendum)
Began having lower abd pain today. N/V/D. Patient states she can't urinate as well.

## 2014-03-21 NOTE — Progress Notes (Signed)
Subjective:     Tonya Kramer is a 24 y.o. female who presents for evaluation of abdominal pain. Onset was this morning. Symptoms have been intermittent. The pain is described as sharp, shooting and stabbing, and is 9/10 in intensity. Pain is located in the LLQ and RLQ without radiation.  Aggravating factors: none.  Alleviating factors: none. Associated symptoms: frequent loose stools, nausea, sweats and vomiting. The patient denies fever, HA, sore throat and reflux. She ate pizza, popcorn, mountain dew, & yogurt today.  Last MC 3 wks ago. No BC. She has not taken anything for symptoms. She had "attack of pain & vomiting while in [facility] today"-became clammy & doubling over in pain, followed by vomiting-probably 200 cc yellow undigested food.   Pt had similar episode 10 days ago, went to ER, reviewed note, imaging & labs. CT scan of abdomen was neg for appendicitis or other abnormality. Labs: WBC 15K. She states pain eased off after she got home & has not returned until today.   The patient's history has been marked as reviewed and updated as appropriate.  Review of Systems Pertinent items are noted in HPI.     Objective:    BP 94/60 mmHg  Pulse 68  Temp(Src) 97.3 F (36.3 C) (Oral)  Ht 5\' 3"  (1.6 m)  Wt 105 lb (47.628 kg)  BMI 18.60 kg/m2  SpO2 97%  LMP 03/01/2014 General appearance: alert, cooperative, appears stated age and moderate distress  Initially no distress, then significant discomfort when pain & vomiting started Head: Normocephalic, without obvious abnormality, atraumatic Eyes: negative findings: lids and lashes normal and conjunctivae and sclerae normal Abdomen: abnormal findings:  no bowel sounds, diffuse tender-more so in lower quads & suprapubic Skin: pale, clammy, cool    Assessment:Plan   1. Lower abdominal pain etio unknown 2. Nausea and vomiting, vomiting of unspecified type 200 cc+ vomit in ofc 3. Diarrhea Several loose stools today  Grandmother to drive pt to  ER for further eval & treatment

## 2014-03-21 NOTE — ED Notes (Signed)
MD at bedside. 

## 2014-03-21 NOTE — ED Notes (Signed)
Patient preparing for discharge. 

## 2014-03-21 NOTE — ED Notes (Signed)
Pelvic cart at bedside. 

## 2014-03-22 LAB — GC/CHLAMYDIA PROBE AMP (~~LOC~~) NOT AT ARMC
CHLAMYDIA, DNA PROBE: NEGATIVE
Neisseria Gonorrhea: NEGATIVE

## 2014-03-23 ENCOUNTER — Telehealth: Payer: Self-pay | Admitting: Family Medicine

## 2014-03-23 NOTE — Telephone Encounter (Signed)
Patient advised.

## 2014-03-23 NOTE — Telephone Encounter (Signed)
Please advise 

## 2014-03-23 NOTE — Telephone Encounter (Signed)
I have reviewed her last office visit note, last 2 ED visits and most recent labs and imaging. I feel confident that none of the information in this set of data indicates a problem with her gallbladder. Reassure pt please.-thx

## 2014-03-23 NOTE — Telephone Encounter (Signed)
PLEASE NOTE: All timestamps contained within this report are represented as [place] Standard Time. CONFIDENTIALTY NOTICE: This fax transmission is intended only for the addressee. It contains information that is legally privileged, confidential or otherwise protected from use or disclosure. If you are not the intended recipient, you are strictly prohibited from reviewing, disclosing, copying using or disseminating any of this information or taking any action in reliance on or regarding this information. If you have received this fax in error, please notify us immediately by telephone so that we can arrange for its return to us. Phone: [phone], Toll-Free: [phone], Fax: [phone] Page: 1 of 1 Call Id: [id number] [facility] Client TELEPHONE ADVICE RECORD [facility] Patient Name: Tonya Kramer DOB: 06/16/1990 Initial Comment Caller states she she has vomiting/diarrhea. Office advised her to go to ED, sent home. Now diarrhea is clear. Nurse Assessment Nurse: Elijah Birkaldwell, RN, Lynda Date/Time (Eastern Time): 03/23/2014 12:03:41 PM Confirm and document reason for call. If symptomatic, describe symptoms. ---Caller states she she has vomiting/diarrhea. Office advised her on Wed. to go to ED for abdominal pain, was seen at ED, but was sent home after nausea rx, pain rx & an IV. About 12 days ago had a CT scan for abdominal pain at the ED. Now diarrhea is clear/watery. No fever. No vomiting since Wed. Has been eating & drinking. Thought she had a stomach virus. Still has some lower abdominal pain at times. Wondering if it could be her gall bladder. Has the patient traveled out of the country within the last 30 days? ---Not Applicable Does the patient require triage? ---Yes Related visit to physician within the last 2 weeks? ---Yes Does the PT have any chronic conditions? (i.e. diabetes, asthma, etc.) ---No Did the patient indicate they were  pregnant? ---No Guidelines Guideline Title Affirmed Question Affirmed Notes Diarrhea Mild diarrhea (all triage questions negative) Final Disposition User [facility] [place] Fieldaldwell, RN, [place]

## 2014-10-02 ENCOUNTER — Other Ambulatory Visit: Payer: Self-pay | Admitting: Obstetrics and Gynecology

## 2014-10-03 LAB — CYTOLOGY - PAP

## 2014-10-11 ENCOUNTER — Ambulatory Visit (INDEPENDENT_AMBULATORY_CARE_PROVIDER_SITE_OTHER): Payer: 59 | Admitting: Family Medicine

## 2014-10-11 ENCOUNTER — Encounter: Payer: Self-pay | Admitting: Family Medicine

## 2014-10-11 VITALS — BP 96/66 | HR 70 | Temp 98.2°F | Resp 14 | Wt 105.4 lb

## 2014-10-11 DIAGNOSIS — J029 Acute pharyngitis, unspecified: Secondary | ICD-10-CM | POA: Diagnosis not present

## 2014-10-11 LAB — POCT RAPID STREP A (OFFICE): Rapid Strep A Screen: NEGATIVE

## 2014-10-11 NOTE — Patient Instructions (Signed)
Trial of mucinex DM or robitussin DM otc as directed on the box. May use OTC nasal saline spray or irrigation solution bid. OTC nonsedating antihistamines prn discussed.

## 2014-10-11 NOTE — Progress Notes (Signed)
OFFICE NOTE  10/11/2014  CC:  Chief Complaint  Patient presents with  . Sore Throat   HPI: Patient is a 24 y.o. Caucasian female who is here for onset of ST about 4-5 days ago, stuffy nose, coughing some, fatigued.  No fever.  No vomiting or diarrhea.  Some nausea since her LMP. Sexually active w/out protection --with husband.  No body aches.  Pertinent PMH:  No signif   MEDS:  NONE  PE: Blood pressure 96/66, pulse 70, temperature 98.2 F (36.8 C), temperature source Temporal, resp. rate 14, weight 105 lb 6.4 oz (47.809 kg), last menstrual period 09/30/2014, SpO2 97 %. VS: noted--normal. Gen: alert, NAD, NONTOXIC APPEARING. HEENT: eyes without injection, drainage, or swelling.  Ears: EACs clear, TMs with normal light reflex and landmarks.  Nose: Clear rhinorrhea, with some dried, crusty exudate adherent to mildly injected mucosa.  No purulent d/c.  No paranasal sinus TTP.  No facial swelling.  Throat and mouth without focal lesion.  No pharyngial swelling, erythema, or exudate.   Neck: supple, no LAD.   LUNGS: CTA bilat, nonlabored resps.   CV: RRR, no m/r/g. EXT: no c/c/e SKIN: no rash  LAB: Rapid strep negative today  IMPRESSION AND PLAN:  Viral pharyngitis/URI. Symptomatic care with tylenol, nonsedating antihistamine prn, push fluids, rest. She wants to make return appt to discuss chronic fatigue.  An After Visit Summary was printed and given to the patient.  FOLLOW UP: prn to discuss chronic fatigue

## 2014-10-18 ENCOUNTER — Encounter: Payer: Self-pay | Admitting: Family Medicine

## 2014-10-18 ENCOUNTER — Ambulatory Visit (INDEPENDENT_AMBULATORY_CARE_PROVIDER_SITE_OTHER): Payer: 59 | Admitting: Family Medicine

## 2014-10-18 ENCOUNTER — Telehealth: Payer: Self-pay | Admitting: *Deleted

## 2014-10-18 VITALS — BP 110/73 | HR 82 | Temp 98.0°F | Resp 16 | Ht 63.0 in | Wt 106.0 lb

## 2014-10-18 DIAGNOSIS — F900 Attention-deficit hyperactivity disorder, predominantly inattentive type: Secondary | ICD-10-CM

## 2014-10-18 DIAGNOSIS — F909 Attention-deficit hyperactivity disorder, unspecified type: Secondary | ICD-10-CM

## 2014-10-18 DIAGNOSIS — R5383 Other fatigue: Secondary | ICD-10-CM | POA: Diagnosis not present

## 2014-10-18 LAB — POCT URINE PREGNANCY: PREG TEST UR: NEGATIVE

## 2014-10-18 MED ORDER — METHYLPHENIDATE HCL ER (OSM) 27 MG PO TBCR: 27.0000 mg | EXTENDED_RELEASE_TABLET | ORAL | Status: DC

## 2014-10-18 NOTE — Progress Notes (Addendum)
OFFICE VISIT  10/18/2014   CC:  Chief Complaint  Patient presents with  . Fatigue   HPI:    Patient is a 24 y.o. Caucasian female who presents for "I always feel tired". Despite getting plenty of sleep--6-8 hours per night.  She wakes up feeling exhausted, drags throught the day. Denies feeling depressed mood, hopelessness, or anhedonia.  Feels like she falls behind with taking care of her house. She has written a list of sx's to show me: constantly losing things, poor focus/can't concentrate, hard time completing tasks, finishes other people's sentences, interrupts people, is impatient, can't remember things, motivated but seems to be very low and I can't seem to get going, tired but "restless" feeling.   Says she has had these problems/sx's for about 3 years. Denies hx of dx of ADHD in school but was homeschooled part of the time. She is sexually active w/out any contraceptive use (with husband).  Review of Systems  Constitutional: Negative for fever and appetite change.  HENT: Negative for congestion.   Eyes: Negative for redness and visual disturbance.  Respiratory: Negative for cough.   Cardiovascular: Negative for chest pain.  Gastrointestinal: Negative for nausea and abdominal pain.  Genitourinary: Negative for dysuria.  Musculoskeletal: Negative for back pain and joint swelling.  Skin: Negative for rash.  Neurological: Negative for weakness and headaches.  Hematological: Negative for adenopathy.  Psychiatric/Behavioral: Negative for dysphoric mood. The patient is not nervous/anxious.    Past Medical History  Diagnosis Date  . Dyspareunia, female   . Wears contact lenses     Past Surgical History  Procedure Laterality Date  . Tonsillectomy and adenoidectomy  age 65  . Inguinal hernia repair Bilateral age 85  . Labioplasty Bilateral 12/01/2013    Procedure: LABIAPLASTY;  Surgeon: Meriel Pica, MD;  Location: [facility];  Service: Gynecology;   Laterality: Bilateral;  . Eeg      24 H EEG normal: (eval by Dr. 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    MEDS: none  No Known Allergies  ROS As per HPI  PE: Blood pressure 110/73, pulse 82, temperature 98 F (36.7 C), temperature source Oral, resp. rate 16, height  (1.6 m), weight 106 lb (48.081 kg), last menstrual period 09/26/2014, SpO2 99 %. LMP 09/26/14 Wt Readings from Last 2 Encounters:  10/18/14 106 lb (48.081 kg)  10/11/14 105 lb 6.4 oz (47.809 kg)    Gen: alert, oriented x 4, affect pleasant.  Lucid thinking and conversation noted. HEENT: PERRLA, EOMI.   Neck: no LAD, mass, or thyromegaly. CV: RRR, no m/r/g LUNGS: CTA bilat, nonlabored. NEURO: no tremor or tics noted on observation.  Coordination intact. CN 2-12 grossly intact bilaterally, strength 5/5 in all extremeties.  No ataxia.   LABS:  Lab Results  Component Value Date   TSH 0.538 02/08/2012   Lab Results  Component Value Date   WBC 13.9* 03/21/2014   HGB 14.3 03/21/2014   HCT 40.4 03/21/2014   MCV 87.3 03/21/2014   PLT 223 03/21/2014     Chemistry      Component Value Date/Time   NA 132* 03/21/2014 1625   K 4.4 03/21/2014 1625   CL 107 03/21/2014 1625   CO2 24 03/21/2014 1625   BUN 11 03/21/2014 1625   CREATININE 0.77 03/21/2014 1625   CREATININE 0.75 02/08/2012 1624  Component Value Date/Time   CALCIUM 8.8 03/21/2014 1625   ALKPHOS 52 03/21/2014 1625   AST 33 03/21/2014 1625   ALT 19 03/21/2014 1625   BILITOT 0.4 03/21/2014 1625     UPT NEGATIVE today  IMPRESSION AND PLAN:  Adult ADHD, with fatigue as a prominent symptom. However, she feels classic inner restlessness and has all the other typical sx's of adult ADHD. We'll see how she responds to stimulant trial: generic concerta , 1 cap qd, #30.  I gave an additional rx to fill on/after 11/16/14  b/c I'll be seeing  her back in 5 wk's time.  Therapeutic expectations and side effect profile of medication discussed today.  Patient's questions answered. Encouraged her to use condoms as contraception.   She is averse to trial of any other contraception at this time b/c of hx of bad experience with IUD.  An After Visit Summary was printed and given to the patient.  FOLLOW UP: Return in about 5 weeks (around 11/22/2014) for f/u adult ADD.

## 2014-10-18 NOTE — Progress Notes (Signed)
Pre visit review using our clinic review tool, if applicable. No additional management support is needed unless otherwise documented below in the visit note. 

## 2014-10-18 NOTE — Telephone Encounter (Signed)
PA for methylphenidate started and sent on 10/18/14 via [email or web address] Key: [id number]

## 2014-10-22 NOTE — Telephone Encounter (Signed)
Letter received stating that PA has been denied. Letter placed on Dr. Verdis Frederickson desk for review.

## 2014-10-23 ENCOUNTER — Telehealth: Payer: Self-pay | Admitting: *Deleted

## 2014-10-23 ENCOUNTER — Other Ambulatory Visit: Payer: Self-pay | Admitting: Family Medicine

## 2014-10-23 MED ORDER — METHYLPHENIDATE HCL ER 20 MG PO TBCR: 20.0000 mg | EXTENDED_RELEASE_TABLET | Freq: Every day | ORAL | Status: DC

## 2014-10-23 MED ORDER — METHYLPHENIDATE HCL ER (OSM) 27 MG PO TBCR: 27.0000 mg | EXTENDED_RELEASE_TABLET | ORAL | Status: DC

## 2014-10-23 NOTE — Telephone Encounter (Signed)
Pt LMOM on 10/23/14 at 10:18am  She stated that she tried to get Rx for Concerta Brand filled and pharmacy told her that it will need a PA. Spoke to pharmacist at [facility] and she stated that the preferred medication is Metadate ER. Please advise. Thanks.

## 2014-10-23 NOTE — Telephone Encounter (Signed)
OK. It looks like her health plan covers BRAND concerta, so I printed rx's for this. When she picks up these rx's, have her drop off the generic concerta rx's I gave her so we can shred them.

## 2014-10-23 NOTE — Telephone Encounter (Signed)
Pt aware that Rx's are at front desk for p/u.

## 2014-10-23 NOTE — Telephone Encounter (Signed)
Pls notify pt that the pharmacist at [facility] said her insurer prefers Metadate ER, so i printed a rx for this med for this month and one for next month--ready for pick up.-thx

## 2014-10-23 NOTE — Telephone Encounter (Signed)
Pt advised and voiced understanding.  She stated that she has one of the old Rx but the pharmacy has the other.

## 2014-10-24 NOTE — Telephone Encounter (Signed)
Pt LMOM on 10/23/14 at 3:54pm stating that she took Rx to pharmacy and it too will need a PA. She stated that she called her insurance company and they told her that we will need to call them at [phone] to give the an ICD-10 code for the Concerta and then they will rerun it. I called and spoke to [place] and she reran PA and it was approved ([id number]) from today to 10/24/15. Pharmacy and pt notified and voiced understanding.

## 2014-11-21 ENCOUNTER — Ambulatory Visit: Payer: 59 | Admitting: Family Medicine

## 2014-11-28 ENCOUNTER — Ambulatory Visit: Payer: 59 | Admitting: Family Medicine

## 2014-12-03 ENCOUNTER — Encounter: Payer: Self-pay | Admitting: Family Medicine

## 2014-12-03 ENCOUNTER — Ambulatory Visit (INDEPENDENT_AMBULATORY_CARE_PROVIDER_SITE_OTHER): Payer: 59 | Admitting: Family Medicine

## 2014-12-03 VITALS — BP 100/67 | HR 82 | Temp 98.3°F | Resp 16 | Ht 63.0 in | Wt 106.0 lb

## 2014-12-03 DIAGNOSIS — F900 Attention-deficit hyperactivity disorder, predominantly inattentive type: Secondary | ICD-10-CM

## 2014-12-03 DIAGNOSIS — F909 Attention-deficit hyperactivity disorder, unspecified type: Secondary | ICD-10-CM

## 2014-12-03 MED ORDER — METHYLPHENIDATE HCL ER (OSM) 54 MG PO TBCR: 54.0000 mg | EXTENDED_RELEASE_TABLET | ORAL | Status: DC

## 2014-12-03 NOTE — Progress Notes (Signed)
OFFICE VISIT  12/03/2014   CC:  Chief Complaint  Patient presents with  . Follow-up    ADD   HPI:    Patient is a 24 y.o. Caucasian female who presents for 6 week f/u adult ADHD.  Started concerta 27mg  qd trial last o/v.  She noted no signif diff in her concentration/focus, inner restlessness, fatigue.  Noted dry mouth but no appetite suppression or other adverse side effect.  No depressed mood, no signif anxiety or panic attacks.   Past Medical History  Diagnosis Date  . Dyspareunia, female   . Wears contact lenses   . Adult ADHD     Past Surgical History  Procedure Laterality Date  . Tonsillectomy and adenoidectomy  age 24  . Inguinal hernia repair Bilateral age 89  . Labioplasty Bilateral 12/01/2013    Procedure: LABIAPLASTY;  Surgeon: Meriel Picaichard M Holland, MD;  [facility] [place];  Service: Gynecology;  Laterality: Bilateral;  . Eeg      25 H EEG normal: (eval by Dr. Karel JarvisAquino, EEG done for jamais vu where she momentarily does not recognize familiar surroundings, + hands "drawing up")  MRI brain normal as well.  . Intrauterine device insertion      with subsequent removal b/c pt "had a bad experience" with it.    Concerta 27 mg qd  No Known Allergies  ROS As per HPI  PE: Blood pressure 100/67, pulse 82, temperature 98.3 F (36.8 C), temperature source Oral, resp. rate 16, height 5\' 3"  (1.6 m), weight 106 lb (48.081 kg), last menstrual period 11/19/2014, SpO2 97 %. Wt Readings from Last 2 Encounters:  12/03/14 106 lb (48.081 kg)  10/18/14 106 lb (48.081 kg)    Gen: alert, oriented x 4, affect pleasant.  Lucid thinking and conversation noted. HEENT: PERRLA, EOMI.   Neck: no LAD, mass, or thyromegaly. CV: RRR, no m/r/g LUNGS: CTA bilat, nonlabored. NEURO: no tremor or tics noted on observation.  Coordination intact. CN 2-12 grossly intact bilaterally, strength 5/5 in all extremeties.  No ataxia.   LABS:  none  IMPRESSION AND PLAN:  Adult  ADHD; no signif response to concerta at 27mg  qd dosing. Will try her at 54mg  concerta qd, #30, no RF--printed and handed to pt today.  An After Visit Summary was printed and given to the patient.  FOLLOW UP: Return in about 4 weeks (around 12/31/2014) for f/u concerta.

## 2014-12-03 NOTE — Progress Notes (Signed)
Pre visit review using our clinic review tool, if applicable. No additional management support is needed unless otherwise documented below in the visit note. 

## 2014-12-05 ENCOUNTER — Ambulatory Visit: Payer: 59 | Admitting: Family Medicine

## 2015-01-02 ENCOUNTER — Ambulatory Visit (INDEPENDENT_AMBULATORY_CARE_PROVIDER_SITE_OTHER): Payer: 59 | Admitting: Family Medicine

## 2015-01-02 ENCOUNTER — Encounter: Payer: Self-pay | Admitting: Family Medicine

## 2015-01-02 VITALS — BP 101/67 | HR 71 | Temp 98.0°F | Resp 16 | Ht 63.0 in | Wt 107.8 lb

## 2015-01-02 DIAGNOSIS — F909 Attention-deficit hyperactivity disorder, unspecified type: Secondary | ICD-10-CM | POA: Insufficient documentation

## 2015-01-02 DIAGNOSIS — F900 Attention-deficit hyperactivity disorder, predominantly inattentive type: Secondary | ICD-10-CM | POA: Diagnosis not present

## 2015-01-02 MED ORDER — LISDEXAMFETAMINE DIMESYLATE 30 MG PO CAPS: 30.0000 mg | ORAL_CAPSULE | Freq: Every day | ORAL | Status: DC

## 2015-01-02 NOTE — Progress Notes (Signed)
OFFICE VISIT  01/02/2015   CC:  Chief Complaint  Patient presents with  . Follow-up    ADD     HPI:    Patient is a 24 y.o. Caucasian female who presents for 1 mo f/u adult ADHD.  Feels improved concentration and focus but only for about 2-3 hours.  Some dry mouth and appetite suppression--mild/mod. Takes it around 8 am daily.   Denies anxiety or mood being sad/down. Says she did not take her concerta today.  Past Medical History  Diagnosis Date  . Dyspareunia, female   . Wears contact lenses   . Adult ADHD     Past Surgical History  Procedure Laterality Date  . Tonsillectomy and adenoidectomy  age 24  . Inguinal hernia repair Bilateral age 89  . Labioplasty Bilateral 12/01/2013    Procedure: LABIAPLASTY;  Surgeon: Meriel Picaichard M Holland, MD;  [facility] [place];  Service: Gynecology;  Laterality: Bilateral;  . Eeg      25 H EEG normal: (eval by Dr. Karel JarvisAquino, EEG done for jamais vu where she momentarily does not recognize familiar surroundings, + hands "drawing up")  MRI brain normal as well.  . Intrauterine device insertion      with subsequent removal b/c pt "had a bad experience" with it.    Outpatient Prescriptions Prior to Visit  Medication Sig Dispense Refill  . methylphenidate (CONCERTA) 54 MG PO CR tablet Take 1 tablet (54 mg total) by mouth every morning. 30 tablet 0   No facility-administered medications prior to visit.    No Known Allergies  ROS As per HPI  PE: Blood pressure 101/67, pulse 71, temperature 98 F (36.7 C), temperature source Oral, resp. rate 16, height 5\' 3"  (1.6 m), weight 107 lb 12 oz (48.875 kg), last menstrual period 12/20/2014, SpO2 99 %. Wt Readings from Last 2 Encounters:  01/02/15 107 lb 12 oz (48.875 kg)  12/03/14 106 lb (48.081 kg)    Gen: alert, oriented x 4, affect pleasant.  Lucid thinking and conversation noted. HEENT: PERRLA, EOMI.   Neck: no LAD, mass, or thyromegaly. CV: RRR, no m/r/g LUNGS: CTA bilat,  nonlabored. NEURO: no tremor or tics noted on observation.  Coordination intact. CN 2-12 grossly intact bilaterally, strength 5/5 in all extremeties.  No ataxia.   LABS:  none  IMPRESSION AND PLAN:  Adult ADHD; inadequate response to what I feel is a pretty good dose of concerta for her size. Will d/c this med and do trial of vyvanse to see if more effective, start at 30mg  cap po qd. Therapeutic expectations and side effect profile of medication discussed today.  Patient's questions answered.  An After Visit Summary was printed and given to the patient.  FOLLOW UP: Return in about 4 weeks (around 01/30/2015) for f/u adult adhd.

## 2015-01-02 NOTE — Progress Notes (Signed)
Pre visit review using our clinic review tool, if applicable. No additional management support is needed unless otherwise documented below in the visit note. 

## 2015-01-10 ENCOUNTER — Encounter: Payer: Self-pay | Admitting: Family Medicine

## 2015-01-10 ENCOUNTER — Ambulatory Visit (INDEPENDENT_AMBULATORY_CARE_PROVIDER_SITE_OTHER): Payer: 59 | Admitting: Family Medicine

## 2015-01-10 VITALS — BP 109/70 | HR 74 | Temp 97.9°F | Resp 16 | Wt 105.2 lb

## 2015-01-10 DIAGNOSIS — J069 Acute upper respiratory infection, unspecified: Secondary | ICD-10-CM

## 2015-01-10 DIAGNOSIS — J029 Acute pharyngitis, unspecified: Secondary | ICD-10-CM | POA: Diagnosis not present

## 2015-01-10 LAB — POCT RAPID STREP A (OFFICE): Rapid Strep A Screen: NEGATIVE

## 2015-01-10 NOTE — Progress Notes (Signed)
OFFICE NOTE  01/10/2015  CC:  Chief Complaint  Patient presents with  . Sore Throat    x 1 week     HPI: Patient is a 24 y.o. Caucasian female who is here for sore throat. Onset about 1 week ago, with runny nose, minimal cough/throat clearing, +HA.  No fever. No rash.  No body aches.  +Fatigue.  Minimal improvement in ST last 1-2 d.  Eating and drinking ok. Not taking any OTC cold meds.   Pertinent PMH:  Past medical, surgical, social, and family history reviewed and no changes are noted since last office visit.  MEDS:  NONE currently  PE: Blood pressure 109/70, pulse 74, temperature 97.9 F (36.6 C), temperature source Oral, resp. rate 16, weight 105 lb 4 oz (47.741 kg), last menstrual period 12/20/2014, SpO2 97 %. VS: noted--normal. Gen: alert, NAD, NONTOXIC APPEARING. HEENT: eyes without injection, drainage, or swelling.  Ears: EACs clear, TMs with normal light reflex and landmarks.  Nose: Clear rhinorrhea, with some dried, crusty exudate adherent to mildly injected mucosa.  No purulent d/c.  No paranasal sinus TTP.  No facial swelling.  Throat and mouth without focal lesion.  No pharyngial swelling, erythema, or exudate.  Mild cobblestoned appearance of post pharyngeal mucosa Neck: supple, no LAD.   LUNGS: CTA bilat, nonlabored resps.   CV: RRR, no m/r/g. EXT: no c/c/e SKIN: no rash  Rapid strep: NEG  IMPRESSION AND PLAN: Viral URI with PND causing ST.   Will send group A strep culture. Discussed symptomatic care with antihistamine + saline nasal spray. An After Visit Summary was printed and given to the patient.  An After Visit Summary was printed and given to the patient.  FOLLOW UP: prn

## 2015-01-10 NOTE — Progress Notes (Signed)
Pre visit review using our clinic review tool, if applicable. No additional management support is needed unless otherwise documented below in the visit note. 

## 2015-01-12 LAB — CULTURE, GROUP A STREP: Organism ID, Bacteria: NORMAL

## 2015-01-30 ENCOUNTER — Telehealth: Payer: Self-pay | Admitting: Family Medicine

## 2015-01-30 ENCOUNTER — Ambulatory Visit: Payer: 59 | Admitting: Family Medicine

## 2015-01-30 MED ORDER — AMPHETAMINE-DEXTROAMPHET ER 20 MG PO CP24: 20.0000 mg | ORAL_CAPSULE | Freq: Every day | ORAL | Status: DC

## 2015-01-30 NOTE — Telephone Encounter (Signed)
Pt's insurance denied coverage of vyvanse. She has failed concerta and she has to fail a trial of either adderall XR, focalin XR, or ritalin LA before they'll reconsider covering vyvanse. Will print rx for adderall XR--pls notify pt.

## 2015-01-31 NOTE — Telephone Encounter (Signed)
Pt is aware that insurance denied coverage for vyvanse and that she will have to try one of the other alternatives. Pt has already p/u Rx for adderall.

## 2015-02-04 ENCOUNTER — Ambulatory Visit: Payer: Self-pay | Admitting: Family Medicine

## 2015-02-25 ENCOUNTER — Encounter: Payer: Self-pay | Admitting: Family Medicine

## 2015-02-25 ENCOUNTER — Ambulatory Visit (INDEPENDENT_AMBULATORY_CARE_PROVIDER_SITE_OTHER): Payer: BLUE CROSS/BLUE SHIELD | Admitting: Family Medicine

## 2015-02-25 VITALS — BP 90/60 | HR 85 | Temp 98.6°F | Resp 20 | Wt 105.8 lb

## 2015-02-25 DIAGNOSIS — J019 Acute sinusitis, unspecified: Secondary | ICD-10-CM | POA: Insufficient documentation

## 2015-02-25 DIAGNOSIS — J0100 Acute maxillary sinusitis, unspecified: Secondary | ICD-10-CM

## 2015-02-25 DIAGNOSIS — I889 Nonspecific lymphadenitis, unspecified: Secondary | ICD-10-CM | POA: Insufficient documentation

## 2015-02-25 DIAGNOSIS — J029 Acute pharyngitis, unspecified: Secondary | ICD-10-CM

## 2015-02-25 LAB — POCT RAPID STREP A (OFFICE): RAPID STREP A SCREEN: NEGATIVE

## 2015-02-25 MED ORDER — FLUTICASONE PROPIONATE 50 MCG/ACT NA SUSP: 2.0000 | Freq: Every day | NASAL | Status: DC

## 2015-02-25 MED ORDER — PREDNISONE 50 MG PO TABS: 50.0000 mg | ORAL_TABLET | Freq: Every day | ORAL | Status: DC

## 2015-02-25 MED ORDER — AMOXICILLIN-POT CLAVULANATE 875-125 MG PO TABS: 1.0000 | ORAL_TABLET | Freq: Two times a day (BID) | ORAL | Status: DC

## 2015-02-25 NOTE — Progress Notes (Signed)
Patient ID: Tonya Kramer, female   DOB: July 17, 1990, 25 y.o.   MRN: [id number]   Subjective:    Patient ID: Tonya Kramer   DOB: 06-28-1990, 25 y.o.    MRN: [id number]  HPI  Sore throat: Patient presents with a sore throat of 3 days duration. She was seen about 6 weeks ago with similar symptoms and presumed viral. She states this time she has swollen glands that hurt to touch. Her prior appt was negative for throat culture, she states she has felt improvement, but no resolution of symptoms since last visit.  Endorses fatigue, chills.  She denies fever,  Nausea, vomit or diarrhea. No rash.  Tdap UTD 3 years ago, no flu vaccination.   Past Medical History  Diagnosis Date  . Dyspareunia, female   . Wears contact lenses   . Adult ADHD    No Known Allergies Past Surgical History  Procedure Laterality Date  . Tonsillectomy and adenoidectomy  age 19  . Inguinal hernia repair Bilateral age 19  . Labioplasty Bilateral 12/01/2013    Procedure: LABIAPLASTY;  Surgeon: Meriel Pica, MD;  Location: [facility];  Service: Gynecology;  Laterality: Bilateral;  . Eeg      24 H EEG normal: (eval by Dr. 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  . Intrauterine device insertion      with subsequent removal b/c pt "had a bad experience" with it.   Social History  Substance Use Topics  . Smoking status: Former Smoker -- 0.50 packs/day for 5 years    Types: Cigarettes    Quit date: 06/30/2012  . Smokeless tobacco: Never Used  . Alcohol Use: No    Review of Systems Negative, with the exception of above mentioned in HPI     Objective:   Physical Exam BP 90/60 mmHg  Pulse 85  Temp(Src) 98.6 F (37 C)  Resp 20  Wt 105 lb 12 oz (47.968 kg)  SpO2 98%  LMP 02/10/2015 (Approximate) Body mass index is 18.74 kg/(m^2). Gen: Afebrile. No acute distress. Nontoxic in appearance. Well  developed, well nourished, caucasian female.  HENT: AT. [place]. Bilateral TM visualized, full, no erythema or bulging. MMM, no oral lesions. Bilateral nares with erytema and swelling. Throat without erythema or  exudates. Cough and Hoarseness not present, TTP max sinus Eyes:Pupils Equal Round Reactive to light, Extraocular movements intact,  Conjunctiva without redness, discharge or icterus. Neck/lymp/endocrine: Supple,bilateral cervical and submandibular  lymphadenopathy CV: RRR  Chest: CTAB, no wheeze or crackles Abd: Soft. NTND. BS present Skin: No  rashes, purpura or petechiae.    Assessment & Plan:  Tonya Kramer is a 25 y.o. present for acute OV  Sore throat. Acute maxillary sinusitis with  lymphadenitis  - Augmenin and prednisone.  - POCT rapid strep A--> negative - flonase, mucinex. Humidifier.  - rest, hydrate.

## 2015-02-25 NOTE — Patient Instructions (Signed)
- Augmenin and prednisone prescribed  - POCT rapid strep A--> negative - flonase, mucinex. Humidifier.  - rest, hydrate.   Sinusitis, Adult Sinusitis is redness, soreness, and inflammation of the paranasal sinuses. Paranasal sinuses are air pockets within the bones of your face. They are located beneath your eyes, in the middle of your forehead, and above your eyes. In healthy paranasal sinuses, mucus is able to drain out, and air is able to circulate through them by way of your nose. However, when your paranasal sinuses are inflamed, mucus and air can become trapped. This can allow bacteria and other germs to grow and cause infection. Sinusitis can develop quickly and last only a short time (acute) or continue over a long period (chronic). Sinusitis that lasts for more than 12 weeks is considered chronic. CAUSES Causes of sinusitis include:  Allergies.  Structural abnormalities, such as displacement of the cartilage that separates your nostrils (deviated septum), which can decrease the air flow through your nose and sinuses and affect sinus drainage.  Functional abnormalities, such as when the small hairs (cilia) that line your sinuses and help remove mucus do not work properly or are not present. SIGNS AND SYMPTOMS Symptoms of acute and chronic sinusitis are the same. The primary symptoms are pain and pressure around the affected sinuses. Other symptoms include:  Upper toothache.  Earache.  Headache.  Bad breath.  Decreased sense of smell and taste.  A cough, which worsens when you are lying flat.  Fatigue.  Fever.  Thick drainage from your nose, which often is green and may contain pus (purulent).  Swelling and warmth over the affected sinuses. DIAGNOSIS Your health care provider will perform a physical exam. During your exam, your health care provider may perform any of the following to help determine if you have acute sinusitis or chronic sinusitis:  Look in your nose  for signs of abnormal growths in your nostrils (nasal polyps).  Tap over the affected sinus to check for signs of infection.  View the inside of your sinuses using an imaging device that has a light attached (endoscope). If your health care provider suspects that you have chronic sinusitis, one or more of the following tests may be recommended:  Allergy tests.  Nasal culture. A sample of mucus is taken from your nose, sent to a lab, and screened for bacteria.  Nasal cytology. A sample of mucus is taken from your nose and examined by your health care provider to determine if your sinusitis is related to an allergy. TREATMENT Most cases of acute sinusitis are related to a viral infection and will resolve on their own within 10 days. Sometimes, medicines are prescribed to help relieve symptoms of both acute and chronic sinusitis. These may include pain medicines, decongestants, nasal steroid sprays, or saline sprays. However, for sinusitis related to a bacterial infection, your health care provider will prescribe antibiotic medicines. These are medicines that will help kill the bacteria causing the infection. Rarely, sinusitis is caused by a fungal infection. In these cases, your health care provider will prescribe antifungal medicine. For some cases of chronic sinusitis, surgery is needed. Generally, these are cases in which sinusitis recurs more than 3 times per year, despite other treatments. HOME CARE INSTRUCTIONS  Drink plenty of water. Water helps thin the mucus so your sinuses can drain more easily.  Use a humidifier.  Inhale steam 3-4 times a day (for example, sit in the bathroom with the shower running).  Apply a warm, moist washcloth  to your face 3-4 times a day, or as directed by your health care provider.  Use saline nasal sprays to help moisten and clean your sinuses.  Take medicines only as directed by your health care provider.  If you were prescribed either an antibiotic  or antifungal medicine, finish it all even if you start to feel better. SEEK IMMEDIATE MEDICAL CARE IF:  You have increasing pain or severe headaches.  You have nausea, vomiting, or drowsiness.  You have swelling around your face.  You have vision problems.  You have a stiff neck.  You have difficulty breathing.   This information is not intended to replace advice given to you by your health care provider. Make sure you discuss any questions you have with your health care provider.   Document Released: 01/12/2005 Document Revised: 02/02/2014 Document Reviewed: 01/27/2011 Elsevier Interactive Patient Education [employer]

## 2015-02-27 ENCOUNTER — Ambulatory Visit (INDEPENDENT_AMBULATORY_CARE_PROVIDER_SITE_OTHER): Payer: BLUE CROSS/BLUE SHIELD | Admitting: Family Medicine

## 2015-02-27 ENCOUNTER — Encounter: Payer: Self-pay | Admitting: Family Medicine

## 2015-02-27 VITALS — BP 94/60 | HR 83 | Temp 98.1°F | Resp 16 | Ht 63.0 in | Wt 107.2 lb

## 2015-02-27 DIAGNOSIS — F900 Attention-deficit hyperactivity disorder, predominantly inattentive type: Secondary | ICD-10-CM | POA: Diagnosis not present

## 2015-02-27 DIAGNOSIS — F909 Attention-deficit hyperactivity disorder, unspecified type: Secondary | ICD-10-CM

## 2015-02-27 MED ORDER — AMPHETAMINE-DEXTROAMPHET ER 20 MG PO CP24: 20.0000 mg | ORAL_CAPSULE | Freq: Every day | ORAL | Status: DC

## 2015-02-27 NOTE — Progress Notes (Signed)
Pre visit review using our clinic review tool, if applicable. No additional management support is needed unless otherwise documented below in the visit note. 

## 2015-02-27 NOTE — Progress Notes (Signed)
OFFICE VISIT  02/27/2015   CC:  Chief Complaint  Patient presents with  . Follow-up    ADHD   HPI:    Patient is a 25 y.o. Caucasian female who presents for 4 wk f/u adult ADHD. Most recent med failed: concerta. Most recent med rx'd to try: adderall XR  qd--about 1 mo ago.  Feels like the adderall provided good effect for concentration/focus/motivation.  Mild side effect of dry mouth.  Lasts about 8 hours.  Recent URI/pharyngitis/sinusitis--says she is already feeling much better from the prednisone and antibiotic.     Past Medical History  Diagnosis Date  . Dyspareunia, female   . Wears contact lenses   . Adult ADHD     Past Surgical History  Procedure Laterality Date  . Tonsillectomy and adenoidectomy  age 18  . Inguinal hernia repair Bilateral age 58  . Labioplasty Bilateral 12/01/2013    Procedure: LABIAPLASTY;  Surgeon: Meriel Pica, MD;  Location: [facility];  Service: Gynecology;  Laterality: Bilateral;  . Eeg      25 H EEG normal: (eval by Dr. 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  . Intrauterine device insertion      with subsequent removal b/c pt "had a bad experience" with it.    Outpatient Prescriptions Prior to Visit  Medication Sig Dispense Refill  . amoxicillin-clavulanate (AUGMENTIN) 875-125 MG tablet Take 1 tablet by mouth 2 (two) times daily. 20 tablet 0  . fluticasone (FLONASE) 50 MCG/ACT nasal spray Place 2 sprays into both nostrils daily. 16 g 0  . predniSONE (DELTASONE) 50 MG tablet Take 1 tablet (50 mg total) by mouth daily with breakfast. 5 tablet 0  . amphetamine-dextroamphetamine (ADDERALL XR) 20 MG 24 hr capsule Take 1 capsule (20 mg total) by mouth daily. 30 capsule 0   No facility-administered medications prior to visit.    No Known Allergies  ROS As per HPI  PE: Blood pressure 94/60, pulse 83, temperature  98.1 F (36.7 C), temperature source Oral, resp. rate 16, height  (1.6 m), weight 107 lb 4 oz (48.648 kg), last menstrual period 02/01/2015, SpO2 97 %. Wt Readings from Last 2 Encounters:  02/27/15 107 lb 4 oz (48.648 kg)  02/25/15 105 lb 12 oz (47.968 kg)    Gen: alert, oriented x 4, affect pleasant.  Lucid thinking and conversation noted. HEENT: PERRLA, EOMI.  Throat: no erythema, swelling, or exudate. Neck: subtle bilat ant cerv LAD with minimal TTP diffusely, without mass or thyromegaly. CV: RRR, no m/r/g LUNGS: CTA bilat, nonlabored. NEURO: no tremor or tics noted on observation.  Coordination intact. CN 2-12 grossly intact bilaterally, strength 5/5 in all extremeties.  No ataxia.   LABS:  none  IMPRESSION AND PLAN:  Adult ADHD: improved on adderall xr  qd.  Continue this. I printed rx's for adderall XR  qd, #30 today for this month, March 2017, and April 2017.  Appropriate fill on/after date was noted on each rx.  Recent sinusitis with reactive neck LAD--improving appropriately on prednisone and augmentin.  An After Visit Summary was printed and given to the patient.  FOLLOW UP: Return in about 3 months (around 05/27/2015) for f/u adult ADHD.

## 2015-03-13 ENCOUNTER — Ambulatory Visit (HOSPITAL_BASED_OUTPATIENT_CLINIC_OR_DEPARTMENT_OTHER)
Admission: RE | Admit: 2015-03-13 | Discharge: 2015-03-13 | Disposition: A | Discharge status: Home / Self Care | Payer: BLUE CROSS/BLUE SHIELD | Source: Ambulatory Visit | Attending: Family Medicine | Admitting: Family Medicine

## 2015-03-13 ENCOUNTER — Encounter: Payer: Self-pay | Admitting: Family Medicine

## 2015-03-13 ENCOUNTER — Ambulatory Visit (INDEPENDENT_AMBULATORY_CARE_PROVIDER_SITE_OTHER): Payer: BLUE CROSS/BLUE SHIELD | Admitting: Family Medicine

## 2015-03-13 ENCOUNTER — Telehealth: Payer: Self-pay | Admitting: Family Medicine

## 2015-03-13 VITALS — BP 110/71 | HR 108 | Temp 98.0°F | Resp 20 | Wt 107.5 lb

## 2015-03-13 DIAGNOSIS — Z87891 Personal history of nicotine dependence: Secondary | ICD-10-CM | POA: Insufficient documentation

## 2015-03-13 DIAGNOSIS — R000 Tachycardia, unspecified: Secondary | ICD-10-CM | POA: Insufficient documentation

## 2015-03-13 DIAGNOSIS — R1030 Lower abdominal pain, unspecified: Secondary | ICD-10-CM | POA: Diagnosis not present

## 2015-03-13 DIAGNOSIS — R1032 Left lower quadrant pain: Secondary | ICD-10-CM | POA: Diagnosis not present

## 2015-03-13 DIAGNOSIS — R0602 Shortness of breath: Secondary | ICD-10-CM | POA: Insufficient documentation

## 2015-03-13 DIAGNOSIS — F411 Generalized anxiety disorder: Secondary | ICD-10-CM

## 2015-03-13 LAB — POCT URINALYSIS DIPSTICK
Bilirubin, UA: NEGATIVE
Blood, UA: NEGATIVE
GLUCOSE UA: NEGATIVE
Ketones, UA: NEGATIVE
Leukocytes, UA: NEGATIVE
NITRITE UA: NEGATIVE
PH UA: 8.5
Protein, UA: NEGATIVE
Spec Grav, UA: 1.02
UROBILINOGEN UA: 1

## 2015-03-13 LAB — POCT URINE PREGNANCY: Preg Test, Ur: NEGATIVE

## 2015-03-13 MED ORDER — LORAZEPAM 0.5 MG PO TABS: 0.5000 mg | ORAL_TABLET | Freq: Two times a day (BID) | ORAL | Status: DC | PRN

## 2015-03-13 MED FILL — LORazepam 0.5 MG TABS: 0.5 | 30 days supply | Qty: 60 | Fill #0

## 2015-03-13 NOTE — Patient Instructions (Signed)
Generalized Anxiety Disorder Generalized anxiety disorder (GAD) is a mental disorder. It interferes with life functions, including relationships, work, and school. GAD is different from normal anxiety, which everyone experiences at some point in their lives in response to specific life events and activities. Normal anxiety actually helps [place] prepare for and get through these life events and activities. Normal anxiety goes away after the event or activity is over.  GAD causes anxiety that is not necessarily related to specific events or activities. It also causes excess anxiety in proportion to specific events or activities. The anxiety associated with GAD is also difficult to control. GAD can vary from mild to severe. People with severe GAD can have intense waves of anxiety with physical symptoms (panic attacks).  SYMPTOMS The anxiety and worry associated with GAD are difficult to control. This anxiety and worry are related to many life events and activities and also occur more days than not for 6 months or longer. People with GAD also have three or more of the following symptoms (one or more in children):  Restlessness.   Fatigue.  Difficulty concentrating.   Irritability.  Muscle tension.  Difficulty sleeping or unsatisfying sleep. DIAGNOSIS GAD is diagnosed through an assessment by your health care provider. Your health care provider will ask you questions aboutyour mood,physical symptoms, and events in your life. Your health care provider may ask you about your medical history and use of alcohol or drugs, including prescription medicines. Your health care provider may also do a physical exam and blood tests. Certain medical conditions and the use of certain substances can cause symptoms similar to those associated with GAD. Your health care provider may refer you to a mental health specialist for further evaluation. TREATMENT The following therapies are usually used to treat GAD:    Medication. Antidepressant medication usually is prescribed for long-term daily control. Antianxiety medicines may be added in severe cases, especially when panic attacks occur.   Talk therapy (psychotherapy). Certain types of talk therapy can be helpful in treating GAD by providing support, education, and guidance. A form of talk therapy called cognitive behavioral therapy can teach you healthy ways to think about and react to daily life events and activities.  Stress managementtechniques. These include yoga, meditation, and exercise and can be very helpful when they are practiced regularly. A mental health specialist can help determine which treatment is best for you. Some people see improvement with one therapy. However, other people require a combination of therapies.   This information is not intended to replace advice given to you by your health care provider. Make sure you discuss any questions you have with your health care provider.   Document Released: 05/09/2012 Document Revised: 02/02/2014 Document Reviewed: 05/09/2012 Elsevier Interactive Patient Education 2016 [employer].  Shortness of Breath Shortness of breath means you have trouble breathing. It could also mean that you have a medical problem. You should get immediate medical care for shortness of breath. CAUSES   Not enough oxygen in the air such as with high altitudes or a smoke-filled room.  Certain lung diseases, infections, or problems.  Heart disease or conditions, such as angina or heart failure.  Low red blood cells (anemia).  Poor physical fitness, which can cause shortness of breath when you exercise.  Chest or back injuries or stiffness.  Being overweight.  Smoking.  Anxiety, which can make you feel like you are not getting enough air. DIAGNOSIS  Serious medical problems can often be found during your physical  exam. Tests may also be done to determine why you are having shortness of breath. Tests  may include:  Chest X-rays.  Lung function tests.  Blood tests.  An electrocardiogram (ECG).  An ambulatory electrocardiogram. An ambulatory ECG records your heartbeat patterns over a 24-hour period.  Exercise testing.  A transthoracic echocardiogram (TTE). During echocardiography, sound waves are used to evaluate how blood flows through your heart.  A transesophageal echocardiogram (TEE).  Imaging scans. Your health care provider may not be able to find a cause for your shortness of breath after your exam. In this case, it is important to have a follow-up exam with your health care provider as directed.  TREATMENT  Treatment for shortness of breath depends on the cause of your symptoms and can vary greatly. HOME CARE INSTRUCTIONS   Do not smoke. Smoking is a common cause of shortness of breath. If you smoke, ask for help to quit.  Avoid being around chemicals or things that may bother your breathing, such as paint fumes and dust.  Rest as needed. Slowly resume your usual activities.  If medicines were prescribed, take them as directed for the full length of time directed. This includes oxygen and any inhaled medicines.  Keep all follow-up appointments as directed by your health care provider. SEEK MEDICAL CARE IF:   Your condition does not improve in the time expected.  You have a hard time doing your normal activities even with rest.  You have any new symptoms. SEEK IMMEDIATE MEDICAL CARE IF:   Your shortness of breath gets worse.  You feel light-headed, faint, or develop a cough not controlled with medicines.  You start coughing up blood.  You have pain with breathing.  You have chest pain or pain in your arms, shoulders, or abdomen.  You have a fever.  You are unable to walk up stairs or exercise the way you normally do. MAKE SURE YOU:  Understand these instructions.  Will watch your condition.  Will get help right away if you are not doing well or get  worse.   This information is not intended to replace advice given to you by your health care provider. Make sure you discuss any questions you have with your health care provider.   Document Released: 10/07/2000 Document Revised: 01/17/2013 Document Reviewed: 03/30/2011 Elsevier Interactive Patient Education [employer]

## 2015-03-13 NOTE — Telephone Encounter (Signed)
Called pt to discuss her cxr: - Normal cxr, appeared mildly hyperinflated. - Pt to take ativan this evening and if symptoms are resolved with anti-anxiety medications, we do not  Need further work up. If she continues to have symptoms despite anti-anxiety medicine then I will place orders for further eval (labs) and she will need to follow up with 1-2 days.  - Please call pt 2/16 in the morning, if still symptomatic I will place orders.    Of note: pt was seen at Gyn this afternoon and does have left ovarian cyst. (documentation only- pt aware)

## 2015-03-13 NOTE — Progress Notes (Signed)
Patient ID: Tonya Kramer, female   DOB: [birth year], 25 y.o.   MRN: [id number]   Subjective:    Patient ID: Tonya Kramer   DOB: 1991/01/10, 25 y.o.    MRN: [id number]  HPI  Shortness of breath: pt states she has been getting  lightheaded, feels like she cannot take deep breath. States she is not feeling more stress than usual, but may be stress because she has high anxiety. She reports she even gets anxious at the grocery store and feels people are watching her. She states discomfort in her   posterior ribs. Leaning forward allows for better breath. She Stopped taking adderall because she was  worried about the breathing  problems. She did not finish the Augmentin for her sinus infection. She did finish the steroids and lymphadenitis of the neck resolved.  She is worried she has walking pneumonia. She denies birth control use, family history or personal h/o blood clots, recent travel and she is a nonsmoker (former). No calf tenderness.   LL pelvic discomfort: pt states she noticed left lower pelvic mass that moves and right inguinal lymph node. The lymph node has gone away. She was treated for ?BV and had a reaction to ?flagyl, but now is taking another medicine. She has a gynecologist [facility] (Tonya Kramer) . Her last period was 03/04/2015, and normal, she states. She has a history of dysparunia, she is in a monogamous relationship and married. She denies any fever, chills, unintentional weight loss, night sweats, dysuria, urinary frequency. She continues to have mild symptoms of vaginal discharge.    Past Medical History  Diagnosis Date  . Dyspareunia, female   . Wears contact lenses   . Adult ADHD    No Known Allergies Past Surgical History  Procedure Laterality Date  . Tonsillectomy and adenoidectomy  age 82  . Inguinal hernia repair Bilateral age 29  . Labioplasty Bilateral 12/01/2013    Procedure: LABIAPLASTY;  Surgeon: Tonya Asal, MD;  Location: [facility];   Service: Gynecology;  Laterality: Bilateral;  . 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  . Intrauterine device insertion      with subsequent removal b/c pt "had a bad experience" with it.   Social History  Substance Use Topics  . Smoking status: Former Smoker -- 0.50 packs/day for 5 years    Types: Cigarettes    Quit date: 06/30/2012  . Smokeless tobacco: Never Used  . Alcohol Use: No    Review of Systems Negative, with the exception of above mentioned in HPI     Objective:   Physical Exam BP 110/71 mmHg  Pulse 108  Temp(Src) 98 F (36.7 C)  Resp 20  Wt 107 lb 8 oz (48.762 kg)  SpO2 100%  LMP 02/01/2015 Body mass index is 19.05 kg/(m^2). Gen: Afebrile. Anxious. Nontoxic in appearance. Well developed, well nourished, caucasian female. Thin appears younger than age. HENT: AT. [place]. MMM, no oral lesions.  Eyes:Pupils Equal Round Reactive to light, Extraocular movements intact,  Conjunctiva without redness, discharge or icterus. Neck/lymp/endocrine: Supple, no cervical LAD or inguinal LAD on exam CV: tachycardia present. No murmur, c/g/r. No edema.  Chest: CTAB, no wheeze or crackles. Good air movement. Mild tripoding. Abd: Soft. Flat, ND, mild TTP left lower pelvic. ? Mild fullness left pelvic.  BS present present. No rebound or guarding.  EXT: no calf tenderness. Neg homans. Skin: No  rashes, purpura or petechiae. Psych: Pt anxious on exam. Normal thought content and judgement.  EKG: ST. Rate variation. No ST changes. No right strain. HR 104.     Assessment & Plan:  Tonya Kramer is a 25 y.o. present for acute OV with multiple complaints.  Lower abd discomfort: Pt encouraged to get in touch with GYN, since she was being treated for infection. There is a small but palpable ? Fullness  Left lower pelvic, with mild tenderness. Could  benefit for in office [place].  - UA negative today for infection or hematuria. - Urine preg negative today - pt has scheduled appt with Tonya Kramer office for this afternoon.   Shortness of breath/Anxiety: Pt mildly  tachycardic on exam and variable HR on ekg. She is hyperventilating during OV. Deep elongated breaths can slow her heart rate and then returns to mild tachy. She is very anxious on exam today.  Discussed anxiety as a possible cause and pt agrees anxiety can be an issue for her. She agreed to low dose ativan BID PRN to see if helps resolve her "shortness of breath" . Lung exam, is normal today. - CXR now, pt will be called with results.  - Low risk for PE (Wells 1.5), No right strain on EKG  (No S1Q3T3) - there is a mild positional component, with recent illness ? Pericarditis is a possibility. --> consider NSAIDS - Could consider pericarditis, anxiety, PE and hyperthyroid.  - consider PFT'S if continued complaints and normal studies.  - Await CXR results, if normal will proceed with CBC, trop, ESR, D-dimer,TSH and LDH. Pt to take a trial of ativan(low dose) after she returns home to help rule out anxiety.  F/u 2 days Greater than 45 minutes was spent with patient, greater than 50% of that time was spent face-to-face with patient counseling and coordinating care.

## 2015-03-14 NOTE — Telephone Encounter (Signed)
Spoke with patient she states she is feeling better but a little sore around rib area. Patient denies any SHOB at this time. Patient instructed to hold Adderall per Dr Claiborne Billings and appt scheduled for follow up on 03/18/15. Patient verbalized understanding.

## 2015-03-14 NOTE — Telephone Encounter (Signed)
Left message for patient to return call.

## 2015-03-18 ENCOUNTER — Ambulatory Visit (INDEPENDENT_AMBULATORY_CARE_PROVIDER_SITE_OTHER): Payer: BLUE CROSS/BLUE SHIELD | Admitting: Family Medicine

## 2015-03-18 ENCOUNTER — Encounter: Payer: Self-pay | Admitting: Family Medicine

## 2015-03-18 ENCOUNTER — Telehealth: Payer: Self-pay | Admitting: Family Medicine

## 2015-03-18 VITALS — BP 97/66 | HR 101 | Temp 98.3°F | Resp 20 | Wt 109.5 lb

## 2015-03-18 DIAGNOSIS — F418 Other specified anxiety disorders: Secondary | ICD-10-CM

## 2015-03-18 DIAGNOSIS — F411 Generalized anxiety disorder: Secondary | ICD-10-CM | POA: Diagnosis not present

## 2015-03-18 DIAGNOSIS — F419 Anxiety disorder, unspecified: Principal | ICD-10-CM | POA: Insufficient documentation

## 2015-03-18 DIAGNOSIS — F329 Major depressive disorder, single episode, unspecified: Secondary | ICD-10-CM | POA: Insufficient documentation

## 2015-03-18 LAB — CBC WITH DIFFERENTIAL/PLATELET
BASOS ABS: 0 10*3/uL (ref 0.0–0.1)
Basophils Relative: 0.2 % (ref 0.0–3.0)
EOS ABS: 0 10*3/uL (ref 0.0–0.7)
Eosinophils Relative: 0.7 % (ref 0.0–5.0)
HCT: 40 % (ref 36.0–46.0)
Hemoglobin: 13.6 g/dL (ref 12.0–15.0)
LYMPHS ABS: 2 10*3/uL (ref 0.7–4.0)
Lymphocytes Relative: 29.6 % (ref 12.0–46.0)
MCHC: 34.1 g/dL (ref 30.0–36.0)
MCV: 91 fl (ref 78.0–100.0)
MONO ABS: 0.4 10*3/uL (ref 0.1–1.0)
Monocytes Relative: 5.8 % (ref 3.0–12.0)
NEUTROS PCT: 63.7 % (ref 43.0–77.0)
Neutro Abs: 4.4 10*3/uL (ref 1.4–7.7)
Platelets: 200 10*3/uL (ref 150.0–400.0)
RBC: 4.39 Mil/uL (ref 3.87–5.11)
RDW: 12.8 % (ref 11.5–15.5)
WBC: 6.9 10*3/uL (ref 4.0–10.5)

## 2015-03-18 LAB — TSH: TSH: 3.7 u[IU]/mL (ref 0.35–4.50)

## 2015-03-18 MED ORDER — LORAZEPAM 0.5 MG PO TABS: 0.5000 mg | ORAL_TABLET | Freq: Two times a day (BID) | ORAL | Status: DC | PRN

## 2015-03-18 MED ORDER — ESCITALOPRAM OXALATE 5 MG PO TABS: ORAL_TABLET | ORAL | Status: DC

## 2015-03-18 NOTE — Patient Instructions (Addendum)
Lexapro 5 mg for 7 days and then 10 mg a day. Follow up 4 weeks.  Ativan as needed up to two times a day.  I will call  You with labs once available.   Generalized Anxiety Disorder Generalized anxiety disorder (GAD) is a mental disorder. It interferes with life functions, including relationships, work, and school. GAD is different from normal anxiety, which everyone experiences at some point in their lives in response to specific life events and activities. Normal anxiety actually helps [place] prepare for and get through these life events and activities. Normal anxiety goes away after the event or activity is over.  GAD causes anxiety that is not necessarily related to specific events or activities. It also causes excess anxiety in proportion to specific events or activities. The anxiety associated with GAD is also difficult to control. GAD can vary from mild to severe. People with severe GAD can have intense waves of anxiety with physical symptoms (panic attacks).  SYMPTOMS The anxiety and worry associated with GAD are difficult to control. This anxiety and worry are related to many life events and activities and also occur more days than not for 6 months or longer. People with GAD also have three or more of the following symptoms (one or more in children):  Restlessness.   Fatigue.  Difficulty concentrating.   Irritability.  Muscle tension.  Difficulty sleeping or unsatisfying sleep. DIAGNOSIS GAD is diagnosed through an assessment by your health care provider. Your health care provider will ask you questions aboutyour mood,physical symptoms, and events in your life. Your health care provider may ask you about your medical history and use of alcohol or drugs, including prescription medicines. Your health care provider may also do a physical exam and blood tests. Certain medical conditions and the use of certain substances can cause symptoms similar to those associated with GAD. Your health  care provider may refer you to a mental health specialist for further evaluation. TREATMENT The following therapies are usually used to treat GAD:   Medication. Antidepressant medication usually is prescribed for long-term daily control. Antianxiety medicines may be added in severe cases, especially when panic attacks occur.   Talk therapy (psychotherapy). Certain types of talk therapy can be helpful in treating GAD by providing support, education, and guidance. A form of talk therapy called cognitive behavioral therapy can teach you healthy ways to think about and react to daily life events and activities.  Stress managementtechniques. These include yoga, meditation, and exercise and can be very helpful when they are practiced regularly. A mental health specialist can help determine which treatment is best for you. Some people see improvement with one therapy. However, other people require a combination of therapies.   This information is not intended to replace advice given to you by your health care provider. Make sure you discuss any questions you have with your health care provider.   Document Released: 05/09/2012 Document Revised: 02/02/2014 Document Reviewed: 05/09/2012 Elsevier Interactive Patient Education [employer]

## 2015-03-18 NOTE — Progress Notes (Signed)
Patient ID: Tonya Kramer, female   DOB: 04-10-1990, 25 y.o.   MRN: [id number]   Subjective:    Patient ID: Tonya Kramer   DOB: Jan 11, 1991, 25 y.o.    MRN: [id number]  HPI  Shortness of breath/anxiety: Pt returns today and states her symptoms are much improved with ativan use. She is able to take deep breaths, and does not have anymore shortness of breath or chest discomfort. She also states she is able to sleep much better with the use of the medication. Pt admits to increased anxiety in her daily life, including driving, grocery shopping and with her children.   Depression screen PHQ 2/9 03/18/2015  Decreased Interest 1  Down, Depressed, Hopeless 0  PHQ - 2 Score 1  Altered sleeping 3  Tired, decreased energy 3  Change in appetite 2  Feeling bad or failure about yourself  1  Trouble concentrating 3  Moving slowly or fidgety/restless 1  Suicidal thoughts 0  PHQ-9 Score 14  Difficult doing work/chores Somewhat difficult    GAD 7 : Generalized Anxiety Score 03/18/2015  Nervous, Anxious, on Edge 2  Control/stop worrying 3  Worry too much - different things 3  Trouble relaxing 3  Restless 3  Easily annoyed or irritable 2  Afraid - awful might happen 3  Total GAD 7 Score 19  Anxiety Difficulty Very difficult   Mood disorder: Negative  Past Medical History  Diagnosis Date  . Dyspareunia, female   . Wears contact lenses   . Adult ADHD    No Known Allergies Past Surgical History  Procedure Laterality Date  . Tonsillectomy and adenoidectomy  age 89  . Inguinal hernia repair Bilateral age 88  . Labioplasty Bilateral 12/01/2013    Procedure: LABIAPLASTY;  Surgeon: Meriel Pica, MD;  Location: [facility];  Service: Gynecology;  Laterality: Bilateral;  . Eeg      25 H EEG normal: (eval by Dr. 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  . Intrauterine device  insertion      with subsequent removal b/c pt "had a bad experience" with it.   Social History  Substance Use Topics  . Smoking status: Former Smoker -- 0.50 packs/day for 5 years    Types: Cigarettes    Quit date: 06/30/2012  . Smokeless tobacco: Never Used  . Alcohol Use: No    Review of Systems Negative, with the exception of above mentioned in HPI  Objective:   Physical Exam BP 97/66 mmHg  Pulse 101  Temp(Src) 98.3 F (36.8 C)  Resp 20  Wt 109 lb 8 oz (49.669 kg)  SpO2 98%  LMP 03/04/2015 Body mass index is 19.4 kg/(m^2). Gen: Afebrile.  Nontoxic in appearance. Well developed, well nourished, caucasian female. Thin appears younger than age. HENT: AT. [place]. MMM, no oral lesions.  Eyes:Pupils Equal Round Reactive to light, Extraocular movements intact,  Conjunctiva without redness, discharge or icterus. CV: mild tachycardia present. No murmur, c/g/r. No edema.  Chest: CTAB, no wheeze or crackles. Good air movement.  Psych: Pt anxious, but improved, on exam. Normal thought content and judgement.   Assessment & Plan:  Tonya Kramer is a 25 y.o. present for acute OV for f/u on anxiety/shortness of breath.  Shortness of breath/Anxiety/depression:  - symptoms much improved with PRN ativan. Discussed daily SSRI and pt amendable to try. - Start lexapro 5 mg  for 7 days and then taper to 10 mg a day.  - ativan BID PRN and hopefully will be able to discontinue after lexapro therapeutic. Refilled x1 today (march->april) - CBC, TSH today. - F/U 3-4 weeks    > 25 minutes spent with patient, >50% of time spent face to face counseling patient and coordinating care.

## 2015-03-18 NOTE — Telephone Encounter (Signed)
Please call pt: - Labs are normal.  

## 2015-03-19 NOTE — Telephone Encounter (Signed)
Spoke with patient reviewed lab results. 

## 2015-04-10 ENCOUNTER — Other Ambulatory Visit: Payer: Self-pay | Admitting: Family Medicine

## 2015-04-10 NOTE — Telephone Encounter (Signed)
Received refill request for lexapro from mail-in pharmacy. Please call patient, she has an appointment in a few days to follow-up on the start of this medication last month. I do not want to refill her prescription until after we have her follow-up appointment if possible, in case we would have to change anything.  - If she does not have enough to last her until her appointment, we can call in one more month prescription to her local pharmacy if the medication is working well for her and she doesn't have any side effects. -  I would still want to follow-up with her. - Please advise

## 2015-04-15 ENCOUNTER — Encounter: Payer: Self-pay | Admitting: Family Medicine

## 2015-04-15 ENCOUNTER — Ambulatory Visit (INDEPENDENT_AMBULATORY_CARE_PROVIDER_SITE_OTHER): Payer: BLUE CROSS/BLUE SHIELD | Admitting: Family Medicine

## 2015-04-15 VITALS — BP 104/70 | HR 85 | Temp 98.0°F | Resp 20 | Wt 103.5 lb

## 2015-04-15 DIAGNOSIS — F418 Other specified anxiety disorders: Secondary | ICD-10-CM

## 2015-04-15 DIAGNOSIS — F419 Anxiety disorder, unspecified: Principal | ICD-10-CM

## 2015-04-15 DIAGNOSIS — F329 Major depressive disorder, single episode, unspecified: Secondary | ICD-10-CM

## 2015-04-15 MED ORDER — ESCITALOPRAM OXALATE 10 MG PO TABS: ORAL_TABLET | ORAL | Status: DC

## 2015-04-15 NOTE — Progress Notes (Signed)
Patient ID: Tonya Kramer, female   DOB: [birth year], 25 y.o.   MRN: [id number]   Subjective:    Patient ID: Tonya Kramer   DOB: 1990/10/08, 25 y.o.    MRN: [id number]  HPI  anxiety: Pt returns today for follow up on anxiety after start of lexapro. She misinderstood and has only been taking 5 mg daily. She reports improvement with it use, but still having some symptoms when in a crowed location. She is using her ativan, but not frequently because it makes her sleepy.  She is able to take deep breaths, and does not have anymore shortness of breath or chest discomfort. She also states she is able to sleep much better with the use of the medication. Pt admits to increased anxiety in her daily life, including driving, grocery shopping and with her children.   Depression screen PHQ 2/9 03/18/2015  Decreased Interest 1  Down, Depressed, Hopeless 0  PHQ - 2 Score 1  Altered sleeping 3  Tired, decreased energy 3  Change in appetite 2  Feeling bad or failure about yourself  1  Trouble concentrating 3  Moving slowly or fidgety/restless 1  Suicidal thoughts 0  PHQ-9 Score 14  Difficult doing work/chores Somewhat difficult    GAD 7 : Generalized Anxiety Score 03/18/2015  Nervous, Anxious, on Edge 2  Control/stop worrying 3  Worry too much - different things 3  Trouble relaxing 3  Restless 3  Easily annoyed or irritable 2  Afraid - awful might happen 3  Total GAD 7 Score 19  Anxiety Difficulty Very difficult   Mood disorder: Negative  Past Medical History  Diagnosis Date  . Dyspareunia, female   . Wears contact lenses   . Adult ADHD    No Known Allergies Past Surgical History  Procedure Laterality Date  . Tonsillectomy and adenoidectomy  age 13  . Inguinal hernia repair Bilateral age 67  . Labioplasty Bilateral 12/01/2013    Procedure: LABIAPLASTY;  Surgeon: Meriel Pica, MD;  Location: [facility];  Service: Gynecology;  Laterality: Bilateral;  . Eeg      25 H EEG  normal: (eval by Dr. 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  . Intrauterine device insertion      with subsequent removal b/c pt "had a bad experience" with it.   Social History  Substance Use Topics  . Smoking status: Former Smoker -- 0.50 packs/day for 5 years    Types: Cigarettes    Quit date: 06/30/2012  . Smokeless tobacco: Never Used  . Alcohol Use: No    Review of Systems Negative, with the exception of above mentioned in HPI  Objective:   Physical Exam BP 104/70 mmHg  Pulse 85  Temp(Src) 98 F (36.7 C)  Resp 20  Wt 103 lb 8 oz (46.947 kg)  SpO2 97% Body mass index is 18.34 kg/(m^2). Gen: Afebrile.  Nontoxic in appearance. Well developed, well nourished, caucasian female. Thin appears younger than age. HENT: AT. [place]. MMM, no oral lesions.  Eyes:Pupils Equal Round Reactive to light, Extraocular movements intact,  Conjunctiva without redness, discharge or icterus. Psych: normal affect, dress and demeanor. Normal thought content and judgement.   Assessment & Plan:  Tonya Kramer is a 25 y.o. present for acute OV for f/u on anxiety/shortness of breath.  Anxiety/depression:  - Start  lexapro10 mg a day, increased from  5 mg today. - Continue ativan BID PRN - if 10 mg effective, followup in 3 months. If need more coverage she will call in and we will increase to 20 mg, then f/u in 4 weeks after change.  - F/U 3 monrhs  > 25 minutes spent with patient, >50% of time spent face to face counseling patient and coordinating care.  Electronically Signed by: Felix Pacinienee Kuneff, DO [facility] [place]

## 2015-04-15 NOTE — Patient Instructions (Signed)
-   Start  lexapro10 mg a day, increased from 5 mg today.     - Take 2 of the 5 mg pills you have currently, the new script of lexapro is 10 mg (so you will take 1 pill then).  - ativan BID PRN and hopefully will be able to discontinue after lexapro therapeutic.   If doing well on 10 mg lexapro no need to follow up until 3 months. If needing increased dose call in and I will cal in the increased dose and then I will need to follow up 4 weeks after that change.

## 2015-05-27 ENCOUNTER — Encounter: Payer: Self-pay | Admitting: Family Medicine

## 2015-05-27 ENCOUNTER — Ambulatory Visit (INDEPENDENT_AMBULATORY_CARE_PROVIDER_SITE_OTHER): Payer: BLUE CROSS/BLUE SHIELD | Admitting: Family Medicine

## 2015-05-27 VITALS — BP 100/62 | HR 97 | Temp 98.0°F | Resp 16 | Ht 62.0 in | Wt 104.5 lb

## 2015-05-27 DIAGNOSIS — F329 Major depressive disorder, single episode, unspecified: Secondary | ICD-10-CM

## 2015-05-27 DIAGNOSIS — F411 Generalized anxiety disorder: Secondary | ICD-10-CM

## 2015-05-27 DIAGNOSIS — F419 Anxiety disorder, unspecified: Secondary | ICD-10-CM

## 2015-05-27 DIAGNOSIS — R41840 Attention and concentration deficit: Secondary | ICD-10-CM

## 2015-05-27 DIAGNOSIS — F418 Other specified anxiety disorders: Secondary | ICD-10-CM | POA: Diagnosis not present

## 2015-05-27 MED ORDER — AMPHETAMINE-DEXTROAMPHET ER 10 MG PO CP24: 10.0000 mg | ORAL_CAPSULE | Freq: Every day | ORAL | Status: DC

## 2015-05-27 MED ORDER — ESCITALOPRAM OXALATE 10 MG PO TABS: ORAL_TABLET | ORAL | Status: DC

## 2015-05-27 NOTE — Progress Notes (Signed)
Pre visit review using our clinic review tool, if applicable. No additional management support is needed unless otherwise documented below in the visit note. 

## 2015-05-27 NOTE — Progress Notes (Signed)
OFFICE VISIT  05/27/2015   CC:  Chief Complaint  Patient presents with  . Follow-up    ADHD   HPI:    Patient is a 25 y.o. Caucasian female who presents for 6 week f/u anxiety. Last visit Dr. Claiborne Billings increased her lexapro from  qd to  qd, and she kept her on ativan bid prn. Anxiety/panic well controlled.  She does not need to take ativan anymore. She notes that the lexapro makes her very tired.  Says it worked at  qd as well as it does at  qd. We continue to wonder whether her focus/concentration problems are a symptoms of her anxiety d/o.  Does she truly have ADHD at all?  Discussed referral to [facility] in [place].  Past Medical History  Diagnosis Date  . Dyspareunia, female   . Wears contact lenses   . Adult ADHD     Past Surgical History  Procedure Laterality Date  . Tonsillectomy and adenoidectomy  age 72  . Inguinal hernia repair Bilateral age 75  . Labioplasty Bilateral 12/01/2013    Procedure: LABIAPLASTY;  Surgeon: Meriel Pica, MD;  Location: [facility];  Service: Gynecology;  Laterality: Bilateral;  . Eeg      25 H EEG normal: (eval by Dr. 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  . Intrauterine device insertion      with subsequent removal b/c pt "had a bad experience" with it.    Outpatient Prescriptions Prior to Visit  Medication Sig Dispense Refill  . fluticasone (FLONASE) 50 MCG/ACT nasal spray Place 2 sprays into both nostrils daily. 16 g 0  . LORazepam (ATIVAN) 0.5 MG tablet Take 1 tablet (0.5 mg total) by mouth 2 (two) times daily as needed for anxiety. Do not fill until 04/05/2015 60 tablet 0  . amphetamine-dextroamphetamine (ADDERALL XR) 20 MG 24 hr capsule Take 1 capsule (20 mg total) by mouth daily. 30 capsule 0  . escitalopram (LEXAPRO) 10 MG tablet 10 mg daily 30 tablet 2   No facility-administered  medications prior to visit.    No Known Allergies  ROS As per HPI  PE: Blood pressure 100/62, pulse 97, temperature 98 F (36.7 C), temperature source Oral, resp. rate 16, height  (1.575 m), weight 104 lb 8 oz (47.401 kg), last menstrual period 05/22/2015, SpO2 97 %. Wt Readings from Last 2 Encounters:  05/27/15 104 lb 8 oz (47.401 kg)  04/15/15 103 lb 8 oz (46.947 kg)    Gen: alert, oriented x 4, affect pleasant.  Lucid thinking and conversation noted. HEENT: PERRLA, EOMI.   Neck: no LAD, mass, or thyromegaly. CV: RRR, no m/r/g LUNGS: CTA bilat, nonlabored. NEURO: no tremor or tics noted on observation.  Coordination intact. CN 2-12 grossly intact bilaterally, strength 5/5 in all extremeties.  No ataxia.   LABS:  Lab Results  Component Value Date   WBC 6.9 03/18/2015   HGB 13.6 03/18/2015   HCT 40.0 03/18/2015   MCV 91.0 03/18/2015   PLT 200.0 03/18/2015   Lab Results  Component Value Date   TSH 3.70 03/18/2015   IMPRESSION AND PLAN:  1) GAD with panic: stable on lexapro.  Decrease dose back to  qd---she felt this was as effective as  qd and we want to minimize the sedative side effect she is feeling from the med. She has lorazepam to use bid prn  but has not had to use this much at all.  2) Adult ADHD; questionable diagnosis from the start.  She failed concerta trial, seems to have had subtherapeutic response to adderall XR.  I have referred her to [facility] today for further evaluation. In the meantime, I will ween her off this med.  I gave rx for adderall XR 10mg , 1 cap po qd x 7d, then stop.  An After Visit Summary was printed and given to the patient.  FOLLOW UP: Return in about 3 months (around 08/27/2015) for f/u anxiety.  Signed:  Santiago BumpersPhil Edia Pursifull, MD           05/27/2015

## 2015-06-18 ENCOUNTER — Encounter: Payer: Self-pay | Admitting: Family Medicine

## 2015-06-18 ENCOUNTER — Ambulatory Visit (INDEPENDENT_AMBULATORY_CARE_PROVIDER_SITE_OTHER): Payer: BLUE CROSS/BLUE SHIELD | Admitting: Family Medicine

## 2015-06-18 VITALS — BP 113/64 | HR 70 | Temp 98.0°F | Resp 16 | Ht 62.0 in | Wt 108.5 lb

## 2015-06-18 DIAGNOSIS — W57XXXA Bitten or stung by nonvenomous insect and other nonvenomous arthropods, initial encounter: Secondary | ICD-10-CM

## 2015-06-18 DIAGNOSIS — T148 Other injury of unspecified body region: Secondary | ICD-10-CM

## 2015-06-18 NOTE — Patient Instructions (Signed)
Take otc generic benadryl 25mg  every 6 hours for the swelling and pain in your leg. Take three otc ibuprofen (200 mg each) twice a day with food for the swelling and pain in your leg.

## 2015-06-18 NOTE — Progress Notes (Signed)
Pre visit review using our clinic review tool, if applicable. No additional management support is needed unless otherwise documented below in the visit note. 

## 2015-06-18 NOTE — Progress Notes (Addendum)
OFFICE NOTE  06/18/2015  CC:  Chief Complaint  Patient presents with  . Insect Bite    x 1 day   HPI: Patient is a 25 y.o. Caucasian female who is here for insect bite yesterday on back of R knee.  It itched yesterday. Now feeling lots of pain in the area and the proximal calf.  She doesn't know what bit her.  She put tobacco on it. She shows some photos from yesterday that appear to be a sting with a hive around it, then some ecchymoses that appears to cover the popliteal fossa.  Pertinent PMH:  Past medical, surgical, social, and family history reviewed and no changes are noted since last office visit.  MEDS:  Outpatient Prescriptions Prior to Visit  Medication Sig Dispense Refill  . amphetamine-dextroamphetamine (ADDERALL XR) 10 MG 24 hr capsule Take 1 capsule (10 mg total) by mouth daily. 7 capsule 0  . escitalopram (LEXAPRO) 10 MG tablet 10 mg daily 30 tablet 6  . fluticasone (FLONASE) 50 MCG/ACT nasal spray Place 2 sprays into both nostrils daily. 16 g 0  . LORazepam (ATIVAN) 0.5 MG tablet Take 1 tablet (0.5 mg total) by mouth 2 (two) times daily as needed for anxiety. Do not fill until 04/05/2015 60 tablet 0   No facility-administered medications prior to visit.    PE: Blood pressure 113/64, pulse 70, temperature 98 F (36.7 C), temperature source Oral, resp. rate 16, height 5\' 2"  (1.575 m), weight 108 lb 8 oz (49.215 kg), last menstrual period 05/22/2015, SpO2 100 %. Gen: Alert, well appearing.  Patient is oriented to person, place, time, and situation. Popliteal fossa on R has focal erythematous bite mark--tiny.  No ulceration or vesicle or pustule. She has some fine pinkish papule rash spread on inferior aspect of popliteal fossa and on proximal calf. No ecchymoses.  IMPRESSION AND PLAN: Insect bite, exaggerated local reaction, with pain.  I don't think there is any infection here. I offered Depo medrol 40mg  IM today in office but pt declined, stating she was afraid of  needles. Instructions: Take otc generic benadryl 25mg  every 6 hours for the swelling and pain in your leg. Take three otc ibuprofen (200 mg each) twice a day with food for the swelling and pain in your leg.  An After Visit Summary was printed and given to the patient.  FOLLOW UP: prn  Signed:  Santiago BumpersPhil McGowen, MD           06/18/2015

## 2015-07-05 ENCOUNTER — Ambulatory Visit (INDEPENDENT_AMBULATORY_CARE_PROVIDER_SITE_OTHER): Payer: BLUE CROSS/BLUE SHIELD | Admitting: Family Medicine

## 2015-07-05 ENCOUNTER — Encounter: Payer: Self-pay | Admitting: Family Medicine

## 2015-07-05 VITALS — BP 93/64 | HR 78 | Temp 98.6°F | Resp 20 | Wt 109.5 lb

## 2015-07-05 DIAGNOSIS — R1031 Right lower quadrant pain: Secondary | ICD-10-CM | POA: Insufficient documentation

## 2015-07-05 LAB — POC URINALSYSI DIPSTICK (AUTOMATED)
BILIRUBIN UA: NEGATIVE
Blood, UA: NEGATIVE
Glucose, UA: NEGATIVE
KETONES UA: NEGATIVE
Nitrite, UA: NEGATIVE
PH UA: 5.5
PROTEIN UA: NEGATIVE
SPEC GRAV UA: 1.01
Urobilinogen, UA: 0.2

## 2015-07-05 LAB — C-REACTIVE PROTEIN: CRP: 2.1 mg/dL (ref 0.5–20.0)

## 2015-07-05 LAB — URINALYSIS, ROUTINE W REFLEX MICROSCOPIC
BILIRUBIN URINE: NEGATIVE
Hgb urine dipstick: NEGATIVE
KETONES UR: NEGATIVE
LEUKOCYTES UA: NEGATIVE
NITRITE: NEGATIVE
PH: 6 (ref 5.0–8.0)
SPECIFIC GRAVITY, URINE: 1.01 (ref 1.000–1.030)
Total Protein, Urine: NEGATIVE
Urine Glucose: NEGATIVE
Urobilinogen, UA: 0.2 (ref 0.0–1.0)

## 2015-07-05 LAB — COMPREHENSIVE METABOLIC PANEL
ALT: 11 U/L (ref 0–35)
AST: 20 U/L (ref 0–37)
Albumin: 4.4 g/dL (ref 3.5–5.2)
Alkaline Phosphatase: 52 U/L (ref 39–117)
BILIRUBIN TOTAL: 1 mg/dL (ref 0.2–1.2)
BUN: 7 mg/dL (ref 6–23)
CALCIUM: 9.2 mg/dL (ref 8.4–10.5)
CHLORIDE: 102 meq/L (ref 96–112)
CO2: 30 meq/L (ref 19–32)
CREATININE: 0.71 mg/dL (ref 0.40–1.20)
GFR: 107.22 mL/min (ref >60.00)
GLUCOSE: 62 mg/dL — AB (ref 70–99)
Potassium: 4 mEq/L (ref 3.5–5.1)
SODIUM: 139 meq/L (ref 135–145)
Total Protein: 6.6 g/dL (ref 6.0–8.3)

## 2015-07-05 LAB — CBC WITH DIFFERENTIAL/PLATELET
BASOS ABS: 0 10*3/uL (ref 0.0–0.1)
BASOS PCT: 0.3 % (ref 0.0–3.0)
EOS ABS: 0 10*3/uL (ref 0.0–0.7)
EOS PCT: 0.1 % (ref 0.0–5.0)
HEMATOCRIT: 39.8 % (ref 36.0–46.0)
Hemoglobin: 13.4 g/dL (ref 12.0–15.0)
LYMPHS ABS: 1.6 10*3/uL (ref 0.7–4.0)
Lymphocytes Relative: 12.8 % (ref 12.0–46.0)
MCHC: 33.8 g/dL (ref 30.0–36.0)
MCV: 91 fl (ref 78.0–100.0)
MONO ABS: 0.8 10*3/uL (ref 0.1–1.0)
Monocytes Relative: 6.5 % (ref 3.0–12.0)
NEUTROS ABS: 10 10*3/uL — AB (ref 1.4–7.7)
Neutrophils Relative %: 80.3 % — ABNORMAL HIGH (ref 43.0–77.0)
Platelets: 200 10*3/uL (ref 150.0–400.0)
RBC: 4.38 Mil/uL (ref 3.87–5.11)
RDW: 12.6 % (ref 11.5–15.5)
WBC: 12.4 10*3/uL — ABNORMAL HIGH (ref 4.0–10.5)

## 2015-07-05 LAB — POCT URINE PREGNANCY: Preg Test, Ur: NEGATIVE

## 2015-07-05 LAB — LIPASE: Lipase: 17 U/L (ref 11.0–59.0)

## 2015-07-05 MED ORDER — TRAMADOL HCL 50 MG PO TABS: 50.0000 mg | ORAL_TABLET | Freq: Four times a day (QID) | ORAL | Status: DC | PRN

## 2015-07-05 NOTE — Progress Notes (Signed)
Patient ID: Tonya Kramer, female   DOB: November 22, 1990, 25 y.o.   MRN: [id number]    Tonya Kramer , [birth year], 25 y.o., female MRN: [id number]  CC: Abdominal pain/sore throat Subjective:   Abdominal pain/sore throat: Patient presents with four-day history of sore throat. She states it's worse at night and first thing in the morning. She states this morning she woke up and had mild chills and a low-grade fever, along with abdominal pain. She also endorses a headache with some frontal pressure. Patient endorses nausea and decreased appetite. She denies vomiting, diarrhea or constipation. She reports she had a bowel movement last night that was normal. She has some mild suprapubic discomfort, dysuria and right lower quadrant pain. She states the pain waxes and wanes, but is sharp. She reports both of her children were sick over the last 2 weeks with strep throat. Patient has had bilateral inguinal hernia as a child. No LMP recorded.   No Known Allergies Social History  Substance Use Topics  . Smoking status: Former Smoker -- 0.50 packs/day for 5 years    Types: Cigarettes    Quit date: 06/30/2012  . Smokeless tobacco: Never Used  . Alcohol Use: No   Past Medical History  Diagnosis Date  . Dyspareunia, female   . Wears contact lenses   . Adult ADHD    Past Surgical History  Procedure Laterality Date  . Tonsillectomy and adenoidectomy  age 63  . Inguinal hernia repair Bilateral age 18  . Labioplasty Bilateral 12/01/2013    Procedure: LABIAPLASTY;  Surgeon: Margarette Asal, MD;  Location: [facility];  Service: Gynecology;  Laterality: Bilateral;  . Eeg      25 H EEG normal: (eval by Dr. 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  . Intrauterine device insertion      with subsequent removal b/c pt "had a bad experience" with it.   Family History  Problem Relation Age of  Onset  . Hyperlipidemia Mother   . Fibromyalgia Mother   . Irritable bowel syndrome Mother   . Cancer Mother     skin cancer on face  . Arthritis Mother   . Tuberculosis Mother     carrier  . Hypertension Father   . Asthma Sister   . Cancer Brother     cancer on rib  . Other Brother     heart beat slow  . Arthritis Maternal Grandmother     hands and feet  . Hypertension Maternal Grandfather   . Heart disease Maternal Grandfather   . Cancer Maternal Grandfather     prostate  . COPD Maternal Grandfather     smoker  . Emphysema Maternal Grandfather     smoker  . Migraines Paternal Grandmother   . Depression Paternal Grandmother   . Other Paternal Grandmother     hip replacement  . Arthritis Paternal Grandfather   . Drug abuse Paternal Grandfather   . Asthma Sister      Medication List       This list is accurate as of: 07/05/15 11:40 AM.  Always use your most recent med list.               amphetamine-dextroamphetamine 10 MG 24 hr capsule  Commonly known as:  ADDERALL XR  Take 1 capsule (10 mg total) by mouth daily.     escitalopram 10 MG tablet  Commonly known as:  LEXAPRO  10 mg daily     fluticasone 50 MCG/ACT nasal spray  Commonly known as:  FLONASE  Place 2 sprays into both nostrils daily.     LORazepam 0.5 MG tablet  Commonly known as:  ATIVAN  Take 1 tablet (0.5 mg total) by mouth 2 (two) times daily as needed for anxiety. Do not fill until 04/05/2015         ROS: Negative, with the exception of above mentioned in HPI   Objective:  BP 93/64 mmHg  Pulse 78  Temp(Src) 98.6 F (37 C)  Resp 20  Wt 109 lb 8 oz (49.669 kg)  SpO2 96% Body mass index is 20.02 kg/(m^2). Gen: Afebrile. Appears uncomfortable, appears fatigued. Pleasant female HENT: AT. [place]. Bilateral TM visualized and normal in appearance. MMM, no oral lesions. Bilateral nares mild erythema no swelling. Throat with very mild erythema, no exudates. No cough on exam, mild hoarseness on  exam. Tender to palpation frontal sinus. Eyes:Pupils Equal Round Reactive to light, Extraocular movements intact,  Conjunctiva without redness, discharge or icterus. Neck/lymp/endocrine: Supple, no lymphadenopathy CV: RRR  Chest: CTAB, no wheeze or crackles. Good air movement, normal resp effort.  Abd: Soft. Flat. ND. Moderate tenderness to palpation left periumbilical. Mild tenderness to palpation she will return every week area. Tearful with palpation to right lower quadrant. BS present. No Masses palpated. No rebound, mild guarding present. Skin: No rashes, purpura or petechiae.  Neuro:  Normal gait. PERLA. EOMi. Alert. Oriented x3   Urinalysis    Component Value Date/Time   COLORURINE YELLOW 07/05/2015 [place] 07/05/2015 1201   LABSPEC 1.010 07/05/2015 1201   PHURINE 6.0 07/05/2015 1201   GLUCOSEU NEGATIVE 07/05/2015 1201   GLUCOSEU NEGATIVE 03/21/2014 1454   HGBUR NEGATIVE 07/05/2015 1201   BILIRUBINUR NEGATIVE 07/05/2015 1201   BILIRUBINUR negative 07/05/2015 [place] 07/05/2015 1201   PROTEINUR negative 07/05/2015 1157   PROTEINUR NEGATIVE 03/21/2014 1454   UROBILINOGEN 0.2 07/05/2015 1201   UROBILINOGEN 0.2 07/05/2015 1157   NITRITE NEGATIVE 07/05/2015 1201   NITRITE negative 07/05/2015 1157   LEUKOCYTESUR NEGATIVE 07/05/2015 1201   Assessment/Plan: Tonya Kramer is a 25 y.o. female present for acute OV for  Abdominal pain, RLQ and sore throat - Patient appears exquisitely tender on exam today, tearful when pushing on the right lower quadrant. She has a history of ovarian cyst on the left. She does not have fever today, and she has no GI symptoms. Discussed with the patient her presentation is worrisome for appendicitis versus ovarian pathology. Patient has had frequent abdominal pain in the past with a CT last year and a abdominal ultrasound with the last 6 months and her gynecology office. His past outpatient treatment for abdominal pain  of this nature with the patient today, which would include scheduling an imaging study and completing labs today. Patient is amendable to this treatment. She was encouraged if her symptoms worsen before imaging study, her fever returns and she is to be seen in the emergency room immediately. Patient was prescribed tramadol for severe pain. - Her throat did not appear to be red, there was no exudates. Discussed with her the possibility of a viral infection causing throat symptoms and abdominal symptoms, although I would expect her to have more GI issues. Patient appears more tender on exam thin one would expect with a virus. Sore throat may be just secondary to allergies. - POCT Urinalysis Dipstick (Automated): Negative -  Comp Met (CMET) - C-reactive protein - CBC w/Diff - Lipase - CT Abdomen Pelvis W Contrast; Future - POCT urine pregnancy--> negative - traMADol (ULTRAM) 50 MG tablet; Take 1 tablet (50 mg total) by mouth every 6 (six) hours as needed.  Dispense: 30 tablet; Refill: 0  > 25 minutes spent with patient, >50% of time spent face to face counseling patient and coordinating care.  electronically signed by:  Howard Pouch, DO  [place]

## 2015-07-05 NOTE — Patient Instructions (Signed)
tramadol for pain.  If pain worsens or unable to tolerate food/water, please be seen in ED immediately.  I have ordered a CT, they will call you to schedule.

## 2015-07-06 ENCOUNTER — Ambulatory Visit (HOSPITAL_BASED_OUTPATIENT_CLINIC_OR_DEPARTMENT_OTHER)
Admission: RE | Admit: 2015-07-06 | Discharge: 2015-07-06 | Disposition: A | Discharge status: Home / Self Care | Payer: BLUE CROSS/BLUE SHIELD | Source: Ambulatory Visit | Attending: Family Medicine | Admitting: Family Medicine

## 2015-07-06 ENCOUNTER — Encounter (HOSPITAL_BASED_OUTPATIENT_CLINIC_OR_DEPARTMENT_OTHER): Payer: Self-pay

## 2015-07-06 DIAGNOSIS — R109 Unspecified abdominal pain: Secondary | ICD-10-CM | POA: Diagnosis not present

## 2015-07-06 DIAGNOSIS — R1031 Right lower quadrant pain: Secondary | ICD-10-CM | POA: Insufficient documentation

## 2015-07-06 MED ORDER — IOPAMIDOL (ISOVUE-300) INJECTION 61%
100.0000 mL | Freq: Once | INTRAVENOUS | Status: AC | PRN
  Administered 2015-07-06: 100 mL via INTRAVENOUS

## 2015-07-07 LAB — URINE CULTURE
COLONY COUNT: NO GROWTH
Organism ID, Bacteria: NO GROWTH

## 2015-07-08 ENCOUNTER — Telehealth: Payer: Self-pay | Admitting: Family Medicine

## 2015-07-08 NOTE — Telephone Encounter (Signed)
Pt advised and voiced understanding.  She stated that she is feeling a little better, stated that she still has a sore throat but feels like her symptoms are improving.

## 2015-07-08 NOTE — Telephone Encounter (Signed)
Please call pt: - her CT abd and labs did not show cause for her abd pain. If she is not feeling better with her sinus/throat I would favor treating her for sinus infection since her labs did she show she had a mild bacteria infection.  - Please advise, if not improving with sinus/throat will call in abx

## 2015-07-15 ENCOUNTER — Ambulatory Visit: Payer: BLUE CROSS/BLUE SHIELD | Admitting: Family Medicine

## 2015-08-28 ENCOUNTER — Ambulatory Visit: Payer: BLUE CROSS/BLUE SHIELD | Admitting: Family Medicine

## 2015-08-28 DIAGNOSIS — Z0289 Encounter for other administrative examinations: Secondary | ICD-10-CM

## 2015-10-15 DIAGNOSIS — Z01419 Encounter for gynecological examination (general) (routine) without abnormal findings: Secondary | ICD-10-CM | POA: Diagnosis not present

## 2015-10-15 DIAGNOSIS — Z681 Body mass index (BMI) 19 or less, adult: Secondary | ICD-10-CM | POA: Diagnosis not present

## 2016-02-20 DIAGNOSIS — Z3202 Encounter for pregnancy test, result negative: Secondary | ICD-10-CM | POA: Diagnosis not present

## 2016-02-20 DIAGNOSIS — N939 Abnormal uterine and vaginal bleeding, unspecified: Secondary | ICD-10-CM | POA: Diagnosis not present

## 2016-02-21 ENCOUNTER — Ambulatory Visit (INDEPENDENT_AMBULATORY_CARE_PROVIDER_SITE_OTHER): Payer: BLUE CROSS/BLUE SHIELD | Admitting: Family Medicine

## 2016-02-21 ENCOUNTER — Encounter: Payer: Self-pay | Admitting: Family Medicine

## 2016-02-21 VITALS — BP 108/74 | HR 98 | Temp 98.2°F | Resp 16 | Ht 62.0 in | Wt 113.5 lb

## 2016-02-21 DIAGNOSIS — B349 Viral infection, unspecified: Secondary | ICD-10-CM

## 2016-02-21 DIAGNOSIS — J029 Acute pharyngitis, unspecified: Secondary | ICD-10-CM

## 2016-02-21 LAB — POCT RAPID STREP A (OFFICE): Rapid Strep A Screen: NEGATIVE

## 2016-02-21 NOTE — Progress Notes (Signed)
OFFICE VISIT  02/21/2016   CC:  Chief Complaint  Patient presents with  . Sore Throat    x 2-3 days  . Nausea    x 2-3 weeks   HPI:    Patient is a 26 y.o. Caucasian female who presents for ST, onset 3 d/a. Had some n/v on 02/15/16, has had some nausea on/off since  No diarrhea.  Went to GYN yesterday and UPT was negative there.  Some subjective fever.  Stuffy nose and cough as well.  HA+ and fatigued.  No body aching, though.  Today her ST feels a bit better.   No abd pain.  Took ibuprofen but none today.  Past Medical History:  Diagnosis Date  . Adult ADHD   . Dyspareunia, female   . Wears contact lenses     Past Surgical History:  Procedure Laterality Date  . EEG     25 H EEG normal: (eval by Dr. 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  . INGUINAL HERNIA REPAIR Bilateral age 84  . INTRAUTERINE DEVICE INSERTION     with subsequent removal b/c pt "had a bad experience" with it.  Marland Kitchen LABIOPLASTY Bilateral 12/01/2013   Procedure: LABIAPLASTY;  Surgeon: Meriel Pica, MD;  Location[facility];  Service: Gynecology;  Laterality: Bilateral;  . TONSILLECTOMY AND ADENOIDECTOMY  age 65    Outpatient Medications Prior to Visit  Medication Sig Dispense Refill  . amphetamine-dextroamphetamine (ADDERALL XR) 10 MG 24 hr capsule Take 1 capsule (10 mg total) by mouth daily. (Patient not taking: Reported on 07/05/2015) 7 capsule 0  . escitalopram (LEXAPRO) 10 MG tablet 10 mg daily (Patient not taking: Reported on 07/05/2015) 30 tablet 6  . fluticasone (FLONASE) 50 MCG/ACT nasal spray Place 2 sprays into both nostrils daily. (Patient not taking: Reported on 07/05/2015) 16 g 0  . LORazepam (ATIVAN) 0.5 MG tablet Take 1 tablet (0.5 mg total) by mouth 2 (two) times daily as needed for anxiety. Do not fill until 04/05/2015 (Patient not taking: Reported on 07/05/2015) 60 tablet 0  .  traMADol (ULTRAM) 50 MG tablet Take 1 tablet (50 mg total) by mouth every 6 (six) hours as needed. (Patient not taking: Reported on 02/21/2016) 30 tablet 0   No facility-administered medications prior to visit.     No Known Allergies  ROS As per HPI  PE: Blood pressure 108/74, pulse 98, temperature 98.2 F (36.8 C), temperature source Temporal, resp. rate 16, height 5\' 2"  (1.575 m), weight 113 lb 8 oz (51.5 kg), last menstrual period 02/15/2016, SpO2 98 %. VS: noted--normal. Gen: alert, NAD, NONTOXIC APPEARING. HEENT: eyes without injection, drainage, or swelling.  Ears: EACs clear, TMs with normal light reflex and landmarks.  Nose: Clear rhinorrhea, with some dried, crusty exudate adherent to mildly injected mucosa.  No purulent d/c.  No paranasal sinus TTP.  No facial swelling.  Throat and mouth without focal lesion.  No pharyngial swelling, erythema, or exudate.   Neck: supple, no LAD.   LUNGS: CTA bilat, nonlabored resps.   CV: RRR, no m/r/g. EXT: no c/c/e SKIN: no rash  LABS:  Rapid strep: NEG  IMPRESSION AND PLAN:  Viral syndrome: URI/pharyngitis is the focus. Instructions: Take 600 mg ibuprofen every 6 hours for headache or fever. Get otc generic robitussin DM OR Mucinex DM and use as directed on the packaging for cough and congestion. Use otc  generic saline nasal spray 2-3 times per day to irrigate/moisturize your nasal passages.  An After Visit Summary was printed and given to the patient.  FOLLOW UP: Return if symptoms worsen or fail to improve.  Signed:  Santiago BumpersPhil McGowen, MD           02/21/2016

## 2016-02-21 NOTE — Patient Instructions (Signed)
Take 600 mg ibuprofen every 6 hours for headache or fever. Get otc generic robitussin DM OR Mucinex DM and use as directed on the packaging for cough and congestion. Use otc generic saline nasal spray 2-3 times per day to irrigate/moisturize your nasal passages.

## 2016-02-21 NOTE — Progress Notes (Signed)
Pre visit review using our clinic review tool, if applicable. No additional management support is needed unless otherwise documented below in the visit note. 

## 2016-02-25 DIAGNOSIS — N920 Excessive and frequent menstruation with regular cycle: Secondary | ICD-10-CM | POA: Diagnosis not present

## 2016-02-25 DIAGNOSIS — N924 Excessive bleeding in the premenopausal period: Secondary | ICD-10-CM | POA: Diagnosis not present

## 2016-03-06 DIAGNOSIS — N979 Female infertility, unspecified: Secondary | ICD-10-CM | POA: Diagnosis not present

## 2017-05-31 DIAGNOSIS — K5900 Constipation, unspecified: Secondary | ICD-10-CM | POA: Diagnosis not present

## 2017-05-31 DIAGNOSIS — F419 Anxiety disorder, unspecified: Secondary | ICD-10-CM | POA: Diagnosis not present

## 2017-05-31 DIAGNOSIS — R1084 Generalized abdominal pain: Secondary | ICD-10-CM | POA: Diagnosis not present

## 2017-05-31 DIAGNOSIS — R109 Unspecified abdominal pain: Secondary | ICD-10-CM | POA: Diagnosis not present

## 2017-05-31 DIAGNOSIS — R1030 Lower abdominal pain, unspecified: Secondary | ICD-10-CM | POA: Diagnosis not present

## 2017-11-15 DIAGNOSIS — Z01419 Encounter for gynecological examination (general) (routine) without abnormal findings: Secondary | ICD-10-CM | POA: Diagnosis not present

## 2017-11-15 DIAGNOSIS — Z681 Body mass index (BMI) 19 or less, adult: Secondary | ICD-10-CM | POA: Diagnosis not present

## 2018-02-01 DIAGNOSIS — N390 Urinary tract infection, site not specified: Secondary | ICD-10-CM | POA: Diagnosis not present

## 2018-02-01 DIAGNOSIS — N760 Acute vaginitis: Secondary | ICD-10-CM | POA: Diagnosis not present

## 2018-08-18 DIAGNOSIS — H60332 Swimmer's ear, left ear: Secondary | ICD-10-CM | POA: Diagnosis not present

## 2018-08-18 DIAGNOSIS — H6692 Otitis media, unspecified, left ear: Secondary | ICD-10-CM | POA: Diagnosis not present

## 2018-08-30 DIAGNOSIS — Z20828 Contact with and (suspected) exposure to other viral communicable diseases: Secondary | ICD-10-CM | POA: Diagnosis not present

## 2020-01-27 DIAGNOSIS — N83209 Unspecified ovarian cyst, unspecified side: Secondary | ICD-10-CM

## 2020-01-27 HISTORY — DX: Unspecified ovarian cyst, unspecified side: N83.209

## 2020-09-16 LAB — CBC: RBC: 4.4 (ref 3.87–5.11)

## 2020-09-16 LAB — CBC AND DIFFERENTIAL
HCT: 40 (ref 36–46)
Hemoglobin: 13.7 (ref 12.0–16.0)
Platelets: 244 10*3/uL (ref 150–400)
WBC: 6.3

## 2020-09-16 LAB — BASIC METABOLIC PANEL
BUN: 7 (ref 4–21)
CO2: 27 — AB (ref 13–22)
Chloride: 104 (ref 99–108)
Creatinine: 0.8 (ref 0.5–1.1)
Glucose: 128
Potassium: 4.3 mEq/L (ref 3.5–5.1)
Sodium: 138 (ref 137–147)

## 2020-09-16 LAB — HEPATIC FUNCTION PANEL
ALT: 11 U/L (ref 7–35)
AST: 22 (ref 13–35)
Alkaline Phosphatase: 64 (ref 25–125)
Bilirubin, Total: 0.4

## 2020-09-16 LAB — COMPREHENSIVE METABOLIC PANEL
Albumin: 4.6 (ref 3.5–5.0)
Calcium: 9.6 (ref 8.7–10.7)
eGFR: 109

## 2021-01-04 ENCOUNTER — Other Ambulatory Visit: Payer: Self-pay

## 2021-01-04 ENCOUNTER — Encounter (HOSPITAL_BASED_OUTPATIENT_CLINIC_OR_DEPARTMENT_OTHER): Payer: Self-pay | Admitting: *Deleted

## 2021-01-04 ENCOUNTER — Emergency Department (HOSPITAL_BASED_OUTPATIENT_CLINIC_OR_DEPARTMENT_OTHER)
Admission: EM | Admit: 2021-01-04 | Discharge: 2021-01-05 | Disposition: A | Discharge status: Home / Self Care | Payer: 59 | Attending: Emergency Medicine | Admitting: Emergency Medicine

## 2021-01-04 DIAGNOSIS — Z87891 Personal history of nicotine dependence: Secondary | ICD-10-CM | POA: Insufficient documentation

## 2021-01-04 DIAGNOSIS — N9489 Other specified conditions associated with female genital organs and menstrual cycle: Secondary | ICD-10-CM | POA: Insufficient documentation

## 2021-01-04 DIAGNOSIS — R059 Cough, unspecified: Secondary | ICD-10-CM | POA: Insufficient documentation

## 2021-01-04 DIAGNOSIS — R112 Nausea with vomiting, unspecified: Secondary | ICD-10-CM | POA: Insufficient documentation

## 2021-01-04 DIAGNOSIS — N83201 Unspecified ovarian cyst, right side: Secondary | ICD-10-CM | POA: Insufficient documentation

## 2021-01-04 DIAGNOSIS — R1013 Epigastric pain: Secondary | ICD-10-CM | POA: Diagnosis present

## 2021-01-04 NOTE — ED Notes (Signed)
Pt unable to provide urine specimen at this time

## 2021-01-04 NOTE — ED Triage Notes (Signed)
Right side abdominal pain today with n/v

## 2021-01-05 ENCOUNTER — Emergency Department (HOSPITAL_BASED_OUTPATIENT_CLINIC_OR_DEPARTMENT_OTHER): Payer: 59

## 2021-01-05 LAB — COMPREHENSIVE METABOLIC PANEL
ALT: 12 U/L (ref 0–44)
AST: 21 U/L (ref 15–41)
Albumin: 5 g/dL (ref 3.5–5.0)
Alkaline Phosphatase: 65 U/L (ref 38–126)
Anion gap: 9 (ref 5–15)
BUN: 10 mg/dL (ref 6–20)
CO2: 25 mmol/L (ref 22–32)
Calcium: 9.4 mg/dL (ref 8.9–10.3)
Chloride: 104 mmol/L (ref 98–111)
Creatinine, Ser: 0.73 mg/dL (ref 0.44–1.00)
GFR, Estimated: >60 mL/min (ref >60)
Glucose, Bld: 99 mg/dL (ref 70–99)
Potassium: 3.4 mmol/L — ABNORMAL LOW (ref 3.5–5.1)
Sodium: 138 mmol/L (ref 135–145)
Total Bilirubin: 0.4 mg/dL (ref 0.3–1.2)
Total Protein: 8.1 g/dL (ref 6.5–8.1)

## 2021-01-05 LAB — URINALYSIS, ROUTINE W REFLEX MICROSCOPIC
Bilirubin Urine: NEGATIVE
Glucose, UA: NEGATIVE mg/dL
Hgb urine dipstick: NEGATIVE
Ketones, ur: NEGATIVE mg/dL
Leukocytes,Ua: NEGATIVE
Nitrite: NEGATIVE
Protein, ur: NEGATIVE mg/dL
Specific Gravity, Urine: >1.03 — ABNORMAL HIGH (ref 1.005–1.030)
pH: 5.5 (ref 5.0–8.0)

## 2021-01-05 LAB — CBC WITH DIFFERENTIAL/PLATELET
Abs Immature Granulocytes: 0.03 10*3/uL (ref 0.00–0.07)
Basophils Absolute: 0 10*3/uL (ref 0.0–0.1)
Basophils Relative: 0 %
Eosinophils Absolute: 0.1 10*3/uL (ref 0.0–0.5)
Eosinophils Relative: 1 %
HCT: 40.8 % (ref 36.0–46.0)
Hemoglobin: 14.6 g/dL (ref 12.0–15.0)
Immature Granulocytes: 0 %
Lymphocytes Relative: 19 %
Lymphs Abs: 2.3 10*3/uL (ref 0.7–4.0)
MCH: 32 pg (ref 26.0–34.0)
MCHC: 35.8 g/dL (ref 30.0–36.0)
MCV: 89.5 fL (ref 80.0–100.0)
Monocytes Absolute: 0.7 10*3/uL (ref 0.1–1.0)
Monocytes Relative: 6 %
Neutro Abs: 9.3 10*3/uL — ABNORMAL HIGH (ref 1.7–7.7)
Neutrophils Relative %: 74 %
Platelets: 276 10*3/uL (ref 150–400)
RBC: 4.56 MIL/uL (ref 3.87–5.11)
RDW: 11.7 % (ref 11.5–15.5)
WBC: 12.6 10*3/uL — ABNORMAL HIGH (ref 4.0–10.5)
nRBC: 0 % (ref 0.0–0.2)

## 2021-01-05 LAB — HCG, QUANTITATIVE, PREGNANCY: hCG, Beta Chain, Quant, S: 1 m[IU]/mL (ref <5)

## 2021-01-05 LAB — LIPASE, BLOOD: Lipase: 51 U/L (ref 11–51)

## 2021-01-05 LAB — PREGNANCY, URINE: Preg Test, Ur: NEGATIVE

## 2021-01-05 MED ORDER — HYDROCODONE-ACETAMINOPHEN 5-325 MG PO TABS: 1.0000 | ORAL_TABLET | Freq: Four times a day (QID) | ORAL | 0 refills | Status: DC | PRN

## 2021-01-05 MED ORDER — SODIUM CHLORIDE 0.9 % IV BOLUS
1000.0000 mL | Freq: Once | INTRAVENOUS | Status: AC
  Administered 2021-01-05: 1000 mL via INTRAVENOUS

## 2021-01-05 MED ORDER — IOHEXOL 300 MG/ML  SOLN
100.0000 mL | Freq: Once | INTRAMUSCULAR | Status: AC | PRN
  Administered 2021-01-05: 100 mL via INTRAVENOUS

## 2021-01-05 MED ORDER — FENTANYL CITRATE PF 50 MCG/ML IJ SOSY
50.0000 ug | PREFILLED_SYRINGE | Freq: Once | INTRAMUSCULAR | Status: AC
  Administered 2021-01-05: 50 ug via INTRAVENOUS
  Filled 2021-01-05: qty 1

## 2021-01-05 MED ORDER — ONDANSETRON HCL 4 MG/2ML IJ SOLN
4.0000 mg | Freq: Once | INTRAMUSCULAR | Status: AC
  Administered 2021-01-05: 4 mg via INTRAVENOUS
  Filled 2021-01-05: qty 2

## 2021-01-05 MED ORDER — ONDANSETRON HCL 8 MG PO TABS: 8.0000 mg | ORAL_TABLET | Freq: Three times a day (TID) | ORAL | 0 refills | Status: DC | PRN

## 2021-01-05 NOTE — ED Provider Notes (Signed)
[facility] EMERGENCY DEPARTMENT Provider Note   CSN: [id number] Arrival date & time: 01/04/21  2324     History Chief Complaint  Patient presents with   Abdominal Pain    Tonya Kramer is a 30 y.o. female.  The history is provided by the patient and the spouse.  Abdominal Pain Pain location:  Epigastric and RLQ Pain quality: aching   Pain severity:  Moderate Onset quality:  Gradual Duration:  12 hours Timing:  Intermittent Progression:  Worsening Chronicity:  New Context: not alcohol use   Relieved by:  Nothing Worsened by:  Movement and palpation (drinking fluids) Associated symptoms: cough, nausea and vomiting   Associated symptoms: no diarrhea, no dysuria, no fever, no vaginal bleeding and no vaginal discharge   Risk factors: NSAID use   Risk factors: no alcohol abuse and has not had multiple surgeries   Patient reports both epigastric and right lower quadrant abdominal pain She reports every time she drinks fluids it hurts in her upper abdomen.  She also reports pain in her lower abdomen No fevers, but she does have nausea and vomiting.  She reports she had a recent URI and has been on antibiotic She has never experienced pain like this previously    Past Medical History:  Diagnosis Date   Adult ADHD    Dyspareunia, female    Wears contact lenses     Patient Active Problem List   Diagnosis Date Noted   Abdominal pain, RLQ 07/05/2015   Anxiety and depression 03/18/2015   Tachycardia 03/13/2015   Abdominal pain, left lower quadrant 03/13/2015   Shortness of breath 03/13/2015   Anxiety state 03/13/2015   Sinusitis, acute 02/25/2015   Lymphadenitis 02/25/2015   Adult ADHD 01/02/2015   Nausea with vomiting 03/21/2014   Lower abdominal pain 03/21/2014   Diarrhea 03/21/2014   Seizures (HCC) 04/07/2013   Disturbed sensory perception 04/05/2013   Dysuria 02/08/2012   ADHD (attention deficit hyperactivity disorder) 09/24/2010    Past Surgical  History:  Procedure Laterality Date   EEG     24 H EEG normal: (eval by Dr. 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   INGUINAL HERNIA REPAIR Bilateral age 73   INTRAUTERINE DEVICE INSERTION     with subsequent removal b/c pt "had a bad experience" with it.   LABIOPLASTY Bilateral 12/01/2013   Procedure: LABIAPLASTY;  Surgeon: Meriel Pica, MD;  Location: [facility];  Service: Gynecology;  Laterality: Bilateral;   TONSILLECTOMY AND ADENOIDECTOMY  age 4     OB History     Gravida  2   Para  2   Term  2   Preterm      AB      Living  2      SAB      IAB      Ectopic      Multiple      Live Births  1           Family History  Problem Relation Age of Onset   Hyperlipidemia Mother    Fibromyalgia Mother    Irritable bowel syndrome Mother    Cancer Mother        skin cancer on face   Arthritis Mother    Tuberculosis Mother        carrier   Hypertension Father    Asthma Sister  Cancer Brother        cancer on rib   Other Brother        heart beat slow   Arthritis Maternal Grandmother        hands and feet   Hypertension Maternal Grandfather    Heart disease Maternal Grandfather    Cancer Maternal Grandfather        prostate   COPD Maternal Grandfather        smoker   Emphysema Maternal Grandfather        smoker   Migraines Paternal Grandmother    Depression Paternal Grandmother    Other Paternal Grandmother        hip replacement   Arthritis Paternal Grandfather    Drug abuse Paternal Grandfather    Asthma Sister     Social History   Tobacco Use   Smoking status: Former    Packs/day: 0.50    Years: 5.00    Pack years: 2.50    Types: Cigarettes    Quit date: 06/30/2012    Years since quitting: 8.5   Smokeless tobacco: Never  Vaping Use   Vaping Use: Some days  Substance Use Topics   Alcohol use: No   Drug use:  No    Home Medications Prior to Admission medications   Not on File    Allergies    Patient has no known allergies.  Review of Systems   Review of Systems  Constitutional:  Negative for fever.  HENT:  Positive for congestion.   Respiratory:  Positive for cough.   Gastrointestinal:  Positive for abdominal pain, nausea and vomiting. Negative for diarrhea.  Genitourinary:  Negative for dysuria, vaginal bleeding and vaginal discharge.  All other systems reviewed and are negative.  Physical Exam Updated Vital Signs BP 110/69 (BP Location: Left Arm)   Pulse 72   Temp 98.1 F (36.7 C) (Oral)   Resp 18   Ht 1.6 m (5\' 3" )   Wt 46.7 kg   LMP 12/29/2020   SpO2 100%   BMI 18.25 kg/m   Physical Exam CONSTITUTIONAL: Well developed/well nourished HEAD: Normocephalic/atraumatic EYES: EOMI/PERRL, no icterus ENMT: Mucous membranes dry NECK: supple no meningeal signs SPINE/BACK:entire spine nontender CV: S1/S2 noted, no murmurs/rubs/gallops noted LUNGS: Lungs are clear to auscultation bilaterally, no apparent distress ABDOMEN: soft, mild epigastric tenderness, moderate RLQ tenderness, no rebound or guarding, bowel sounds noted throughout abdomen GU:no cva tenderness NEURO: Pt is awake/alert/appropriate, moves all extremitiesx4.  No facial droop.   EXTREMITIES: pulses normal/equal, full ROM SKIN: warm, color normal PSYCH: no abnormalities of mood noted, alert and oriented to situation  ED Results / Procedures / Treatments   Labs (all labs ordered are listed, but only abnormal results are displayed) Labs Reviewed  URINALYSIS, ROUTINE W REFLEX MICROSCOPIC - Abnormal; Notable for the following components:      Result Value   Specific Gravity, Urine >1.030 (*)    All other components within normal limits  CBC WITH DIFFERENTIAL/PLATELET - Abnormal; Notable for the following components:   WBC 12.6 (*)    Neutro Abs 9.3 (*)    All other components within normal limits   COMPREHENSIVE METABOLIC PANEL - Abnormal; Notable for the following components:   Potassium 3.4 (*)    All other components within normal limits  PREGNANCY, URINE  LIPASE, BLOOD  HCG, QUANTITATIVE, PREGNANCY    EKG None  Radiology CT ABDOMEN PELVIS W CONTRAST  Result Date: 01/05/2021 CLINICAL DATA:  Abdominal pain. EXAM: CT ABDOMEN  AND PELVIS WITH CONTRAST TECHNIQUE: Multidetector CT imaging of the abdomen and pelvis was performed using the standard protocol following bolus administration of intravenous contrast. CONTRAST:  OMNIPAQUE IOHEXOL 300 MG/ML  SOLN COMPARISON:  CT abdomen pelvis dated 07/16/2015. FINDINGS: Lower chest: The visualized lung bases are clear. No intra-abdominal free air.  Small free fluid in the pelvis. Hepatobiliary: No focal liver abnormality is seen. No gallstones, gallbladder wall thickening, or biliary dilatation. Pancreas: Unremarkable. No pancreatic ductal dilatation or surrounding inflammatory changes. Spleen: Normal in size without focal abnormality. Adrenals/Urinary Tract: The adrenal glands are unremarkable. The kidneys, visualized ureters, and urinary bladder appear unremarkable. Stomach/Bowel: Evaluation of the bowel is limited in the absence of oral contrast. There is moderate stool throughout the colon. There is no bowel obstruction. The appendix is suboptimally visualized as it extends into the right hemipelvis in the region of the right adnexa. There is a small stone in the lumen of the appendix as seen on the study of 2017. The visualized appendix however appears unremarkable and without evidence of inflammatory changes. Vascular/Lymphatic: The abdominal aorta and IVC unremarkable. No portal venous gas. There is no adenopathy. Reproductive: The uterus is retroverted or retroflexed. The ovaries are poorly visualized. Bilateral ovarian follicles measure up to 2 cm on the right. Other: None Musculoskeletal: No acute or significant osseous findings.  IMPRESSION: 1. Small free fluid in the pelvis with findings suspicious for a ruptured right ovarian cyst. Further evaluation with pelvic ultrasound recommended. 2. No definite CT evidence of acute appendicitis. The appendix is however suboptimally visualized. 3. Moderate colonic stool burden. No bowel obstruction. Electronically Signed   By: Elgie Collard M.D.   On: 01/05/2021 02:41    Procedures Procedures   Medications Ordered in ED Medications  sodium chloride 0.9 % bolus 1,000 mL (0 mLs Intravenous Stopped 01/05/21 0140)  ondansetron (ZOFRAN) injection 4 mg (4 mg Intravenous Given 01/05/21 0040)  fentaNYL (SUBLIMAZE) injection 50 mcg (50 mcg Intravenous Given 01/05/21 0042)  iohexol (OMNIPAQUE) 300 MG/ML solution 100 mL (100 mLs Intravenous Contrast Given 01/05/21 0209)    ED Course  I have reviewed the triage vital signs and the nursing notes.  Pertinent labs & imaging results that were available during my care of the patient were reviewed by me and considered in my medical decision making (see chart for details).    MDM Rules/Calculators/A&P                           This patient presents to the ED for concern of abdominal pain, this involves an extensive number of treatment options, and is a complaint that carries with it a high risk of complications and morbidity.  The differential diagnosis includes cholecystitis, pancreatitis, cholelithiasis, appendicitis, diverticulitis, ovarian cyst, TOA, ovarian torsion, pyelonephritis, urinary tract infection   Lab Tests:  I Ordered, reviewed, and interpreted labs, which included urinalysis, pregnancy test, electrolytes, liver function panel, complete blood count  Medicines ordered:  I ordered medication fentanyl for pain  Imaging Studies ordered:  I ordered imaging studies which included CT abdomen/pelvis  I independently visualized and interpreted imaging which showed likely ruptured ovarian cyst  Additional history  obtained:  Additional history obtained from spouse Previous records obtained and reviewed previous CT imaging from [facility] reveals patient has had a ruptured ovarian cyst previously   Reevaluation:  After the interventions stated above, I reevaluated the patient and found patient is improved  3:05 AM Patient presented with right  lower quadrant abdominal pain.  CT imaging reveals likely ruptured ovarian cyst.  The radiologist commented that the appendix overall appears normal.  She has no elevation in her white count to suggest appendicitis.  Patient reports she has had these issues previously.  CT imaging from [facility] hospital revealed previous ovarian cyst.  She reports she has been seen previously by gynecology for this but nothing was recommended She will follow back up with her gynecologist tomorrow.  Short course of pain medicine provided.  Overall patient reports feeling improved and is in no acute distress.  At this time, she will not require emergent ultrasound imaging We discussed strict ER return precautions Final Clinical Impression(s) / ED Diagnoses Final diagnoses:  Cyst of right ovary    Rx / DC Orders ED Discharge Orders          Ordered    HYDROcodone-acetaminophen (NORCO/VICODIN) 5-325 MG tablet  Every 6 hours PRN        01/05/21 0302    ondansetron (ZOFRAN) 8 MG tablet  Every 8 hours PRN        01/05/21 0302             Zadie Rhine, MD 01/05/21 [phone]

## 2021-02-17 ENCOUNTER — Telehealth: Payer: Self-pay

## 2021-02-17 NOTE — Telephone Encounter (Signed)
Rec'd disk from [facility] regarding this patient. She is currently not an established patient at our office.  Left a message for patient to call our office. Disc was placed in [place] folder in front office. 02/17/21 [facility]

## 2021-07-03 ENCOUNTER — Encounter: Payer: BLUE CROSS/BLUE SHIELD | Admitting: Family Medicine

## 2022-01-21 NOTE — Progress Notes (Signed)
This encounter was created in error - please disregard.

## 2022-03-11 ENCOUNTER — Encounter (HOSPITAL_BASED_OUTPATIENT_CLINIC_OR_DEPARTMENT_OTHER): Payer: Self-pay | Admitting: Obstetrics and Gynecology

## 2022-03-12 NOTE — H&P (Signed)
NAME: Tonya Kramer, Minhas MEDICAL RECORD NO: [id number] ACCOUNT NO: [id number] DATE OF BIRTH: 10-Nov-1990 PHYSICIAN: Ralene Bathe. Matthew Saras, MD  History and Physical   DATE OF ADMISSION: 03/25/2022  Scheduled for surgery at [facility] on 2/28.  CHIEF COMPLAINT:  Menorrhagia, dysmenorrhea, unresponsive to conservative measures.  HISTORY OF PRESENT ILLNESS:  32 year old G2 P2 with a history of menorrhagia and dysmenorrhea that has not responded well to down regulation with OCPs or nonsteroidal medications.  She has completed childbearing and has also had difficulties with  recurrent ovarian cyst in the past and presents at this time for definitive LAVH, bilateral salpingectomy for these issues.  This procedure including the benefits and risks regarding bleeding, infection, transfusion, adjacent organ injury, the possible  need to complete the surgery by open technique, rationale for bilateral salpingectomy and her expected recovery time.  All discussed, which she understands and accepts.  ALLERGIES:  None.  MEDICATIONS:  She is type off of her Lo Loestrin, not currently on anything except OTC Motrin for dysmenorrhea.  PAST SURGICAL HISTORY:  She has had a hernia repair, labioplasty, 2 vaginal deliveries.  SOCIAL HISTORY:  She is married.  Former smoker.  Occasional alcohol use.  FAMILY HISTORY:  Significant for grandfather with hypertension and heart disease, 2 grandfathers have had prostate cancer, asthma with her father, mother and sister, mother with a history of IBS and arthritis.  PHYSICAL EXAMINATION:   VITAL SIGNS:  Temperature 98.2, blood pressure 120/68. HEENT:  Unremarkable. NECK:  Supple, without masses.  Thyroid nonpalpable. HEART:  Clear. LUNGS:  Clear. BREASTS: Without masses, tenderness, or nipple discharge. ABDOMEN:  Soft, flat, nontender. PELVIC: Vulva, vagina, cervix normal.  Uterus midposition, mobile, nontender.  Adnexa negative. EXTREMITIES:  Unremarkable. NEUROLOGIC:   Unremarkable.  IMPRESSION:  Continued problems with dysmenorrhea and menorrhagia, failed conservative treatment, for LAVH and bilateral salpingectomy procedure and risks discussed as above.   PUS D: 03/11/2022 8:50:56 am T: 03/11/2022 9:34:00 am  JOB: [id number]

## 2022-03-16 NOTE — Progress Notes (Signed)
I called patient to do her pre-op phone call. She stated that she wishes to reschedule her surgery due to financial reasons. I called Stanton Kidney, scheduler at Dr. Delanna Ahmadi office and let her know. Stanton Kidney is planning on calling the patient today to reschedule.

## 2022-03-25 ENCOUNTER — Ambulatory Visit (HOSPITAL_BASED_OUTPATIENT_CLINIC_OR_DEPARTMENT_OTHER): Admit: 2022-03-25 | Payer: BLUE CROSS/BLUE SHIELD | Admitting: Obstetrics and Gynecology

## 2022-03-25 DIAGNOSIS — N946 Dysmenorrhea, unspecified: Secondary | ICD-10-CM

## 2022-03-25 SURGERY — HYSTERECTOMY, VAGINAL, LAPAROSCOPY-ASSISTED, WITH SALPINGECTOMY
Anesthesia: General | Laterality: Bilateral

## 2022-06-03 IMAGING — CT CT ABD-PELV W/ CM
2 of 4 series · 15 of 46 positions shown, 17 images · IV contrast (Omnipaque)
Comparison: CT abdomen pelvis dated 07/16/2015.

CLINICAL DATA: Abdominal pain.

EXAM:
CT ABDOMEN AND PELVIS WITH CONTRAST
TECHNIQUE: Multidetector CT imaging of the abdomen and pelvis was performed
using the standard protocol following bolus administration of
intravenous contrast.
CONTRAST:  100mL OMNIPAQUE IOHEXOL 300 MG/ML  SOLN

[Series 2: axial st · axial · 0.68mm/px · z∈[+563,+968]mm · 12 of 89 slices shown, 14 images]
[im 4/89  soft-tissue]
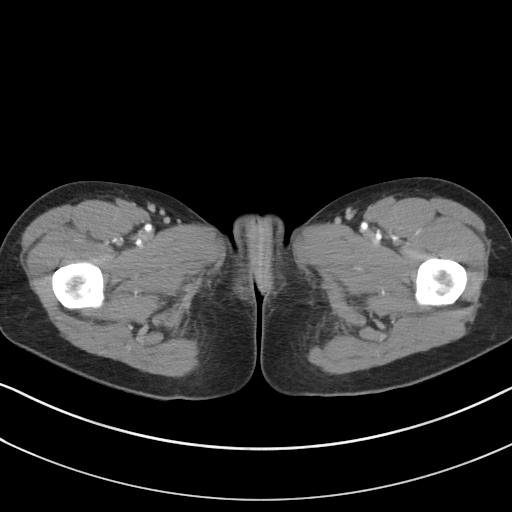
[im 4/89  bone]
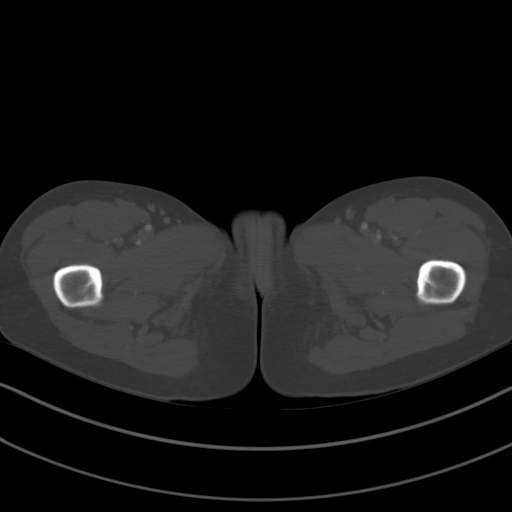
[im 12/89  soft-tissue]
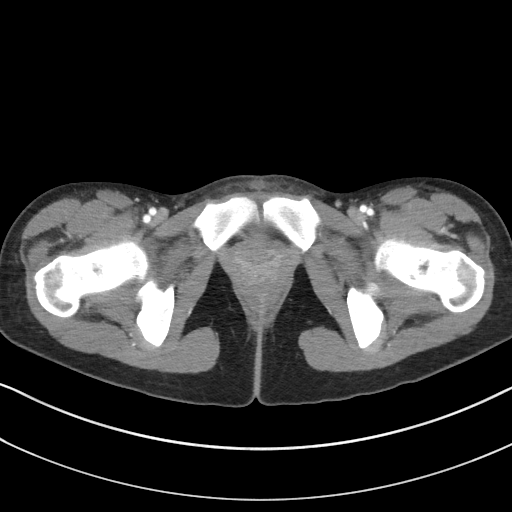
[im 20/89  soft-tissue]
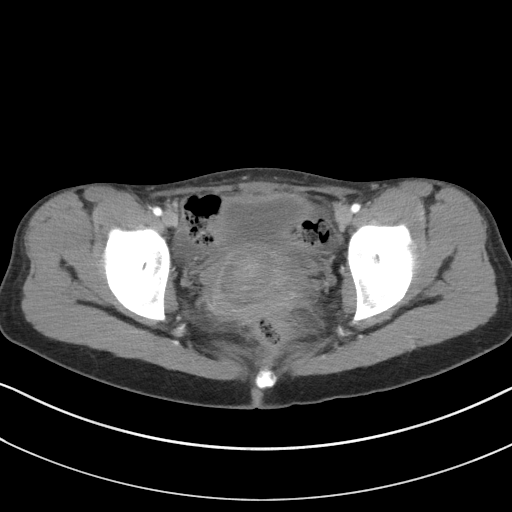
[im 27/89  soft-tissue]
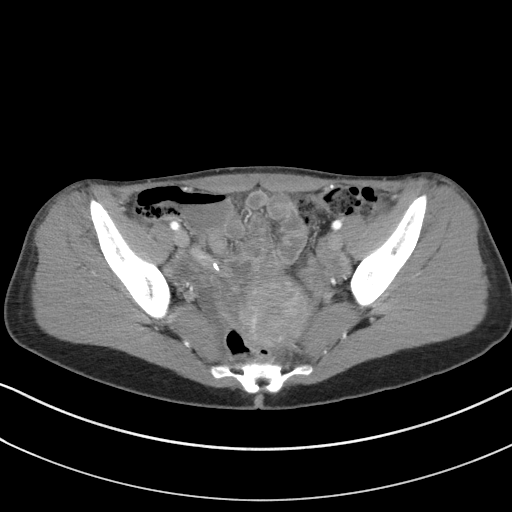
[im 35/89  soft-tissue]
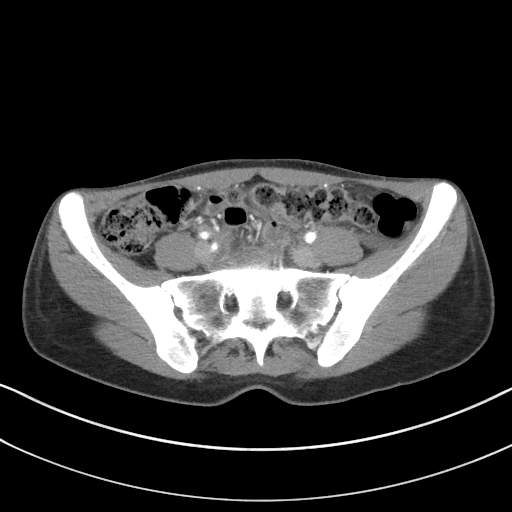
[im 43/89  soft-tissue]
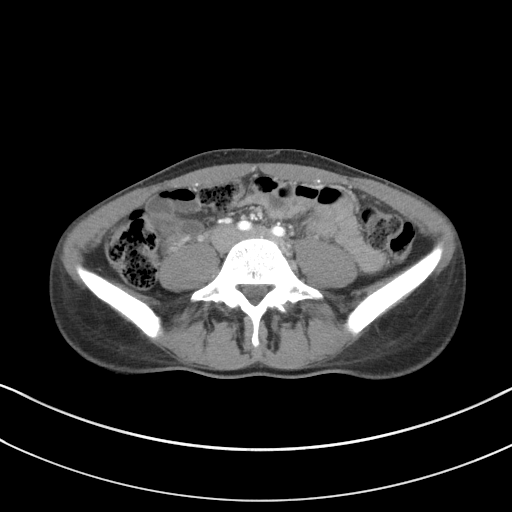
[im 46/89  soft-tissue]
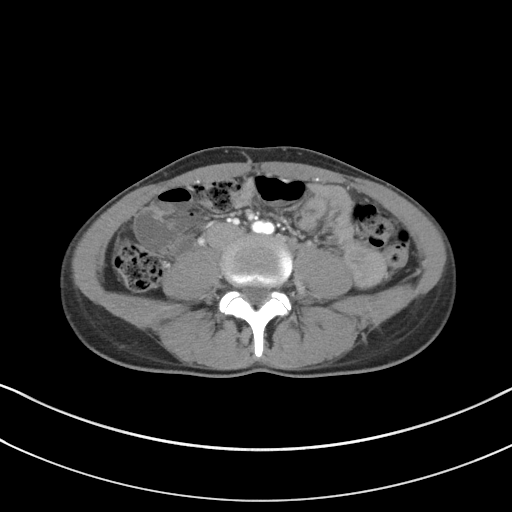
[im 54/89  soft-tissue]
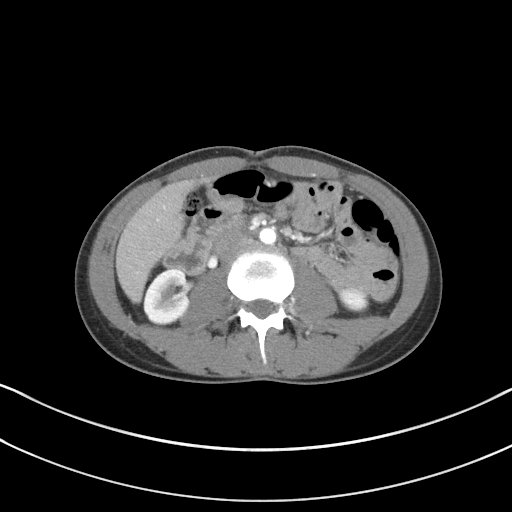
[im 62/89  soft-tissue]
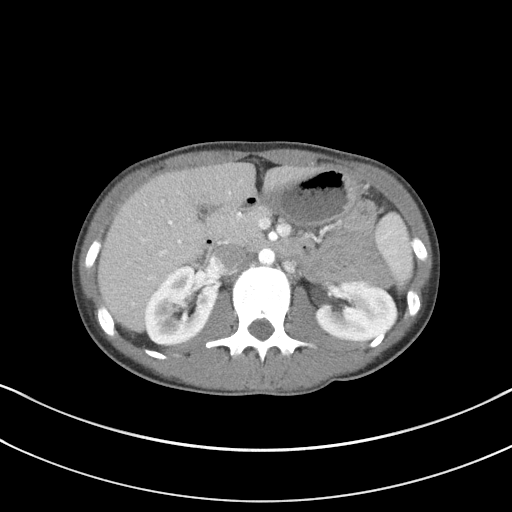
[im 62/89  bone]
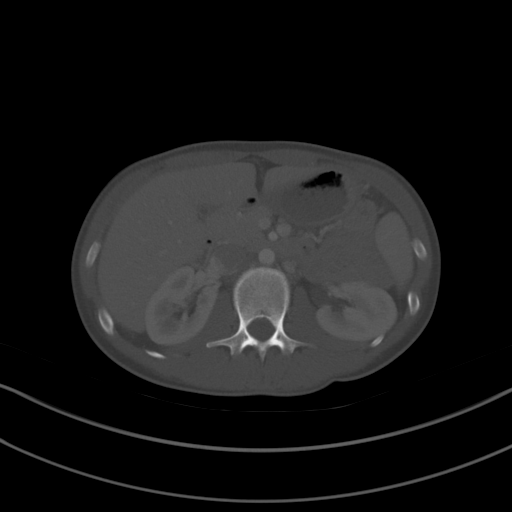
[im 69/89  soft-tissue]
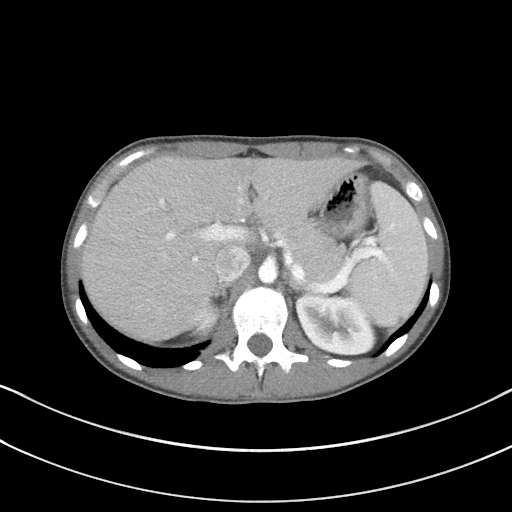
[im 77/89  soft-tissue]
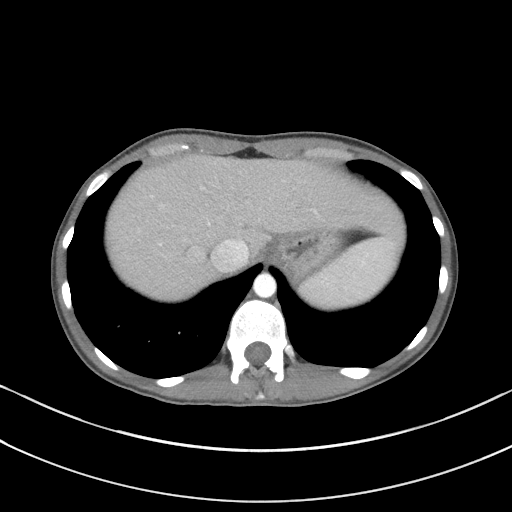
[im 85/89  soft-tissue]
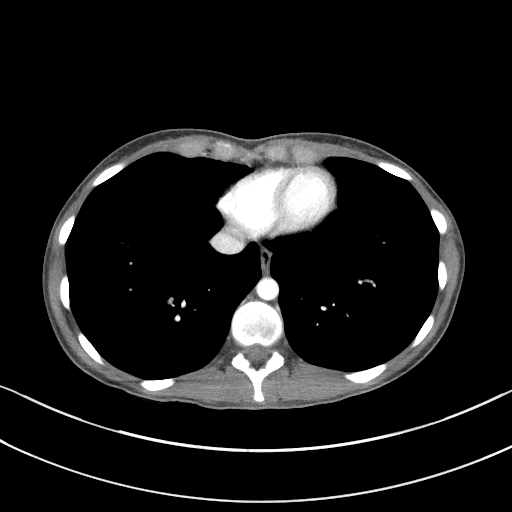

[Series 5: coronal st · coronal · 0.59mm/px · 3 of 67 slices shown]
[im 23/67  soft-tissue]
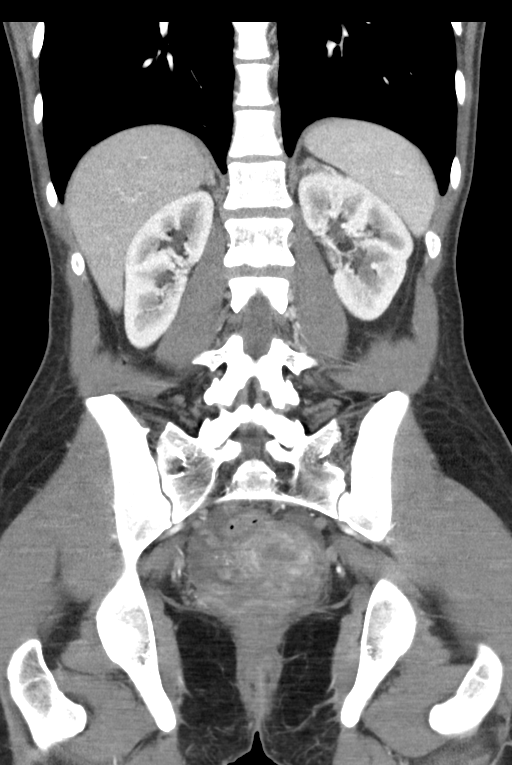
[im 30/67  soft-tissue]
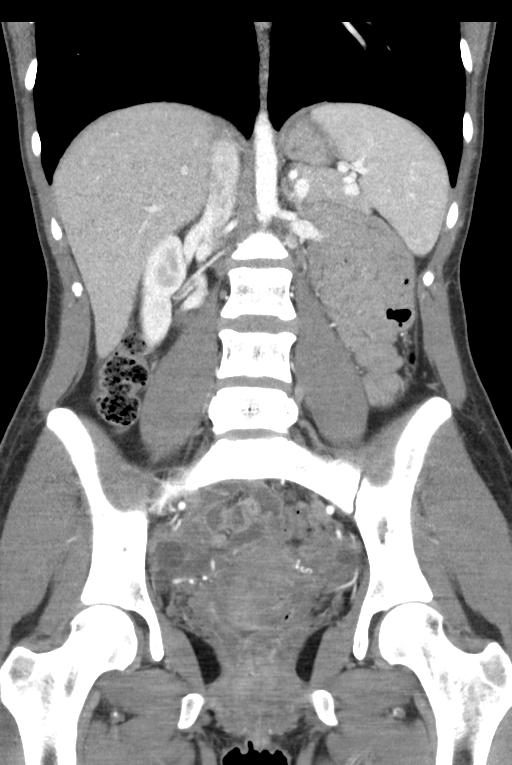
[im 37/67  soft-tissue]
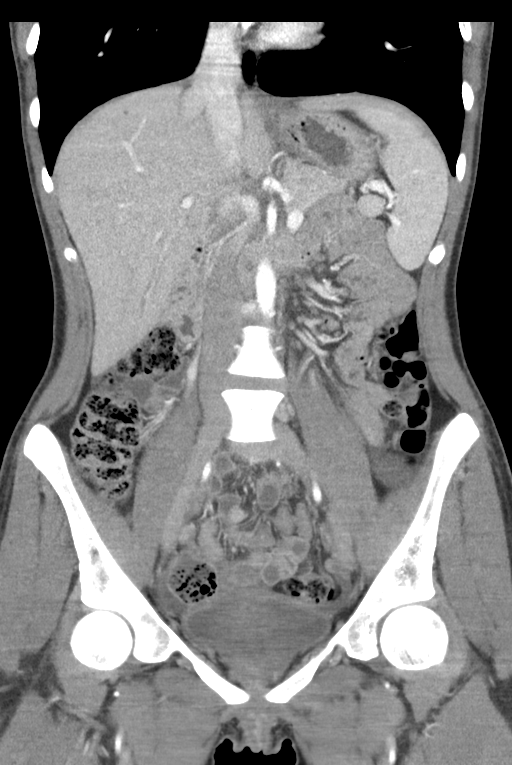

[15 of 46 positions shown; findings below may reference images not displayed]

FINDINGS: Lower chest: The visualized lung bases are clear.

No intra-abdominal free air.  Small free fluid in the pelvis.

Hepatobiliary: No focal liver abnormality is seen. No gallstones,
gallbladder wall thickening, or biliary dilatation.

Pancreas: Unremarkable. No pancreatic ductal dilatation or
surrounding inflammatory changes.

Spleen: Normal in size without focal abnormality.

Adrenals/Urinary Tract: The adrenal glands are unremarkable. The
kidneys, visualized ureters, and urinary bladder appear
unremarkable.

Stomach/Bowel: Evaluation of the bowel is limited in the absence of
oral contrast. There is moderate stool throughout the colon. There
is no bowel obstruction. The appendix is suboptimally visualized as
it extends into the right hemipelvis in the region of the right
adnexa. There is a small stone in the lumen of the appendix as seen
on the study of 1811. The visualized appendix however appears
unremarkable and without evidence of inflammatory changes.

Vascular/Lymphatic: The abdominal aorta and IVC unremarkable. No
portal venous gas. There is no adenopathy.

Reproductive: The uterus is retroverted or retroflexed. The ovaries
are poorly visualized. Bilateral ovarian follicles measure up to 2
cm on the right.

Other: None

Musculoskeletal: No acute or significant osseous findings.
IMPRESSION: 1. Small free fluid in the pelvis with findings suspicious for a
ruptured right ovarian cyst. Further evaluation with pelvic
ultrasound recommended.
2. No definite CT evidence of acute appendicitis. The appendix is
however suboptimally visualized.
3. Moderate colonic stool burden. No bowel obstruction.

## 2023-02-04 ENCOUNTER — Encounter (HOSPITAL_BASED_OUTPATIENT_CLINIC_OR_DEPARTMENT_OTHER): Payer: Self-pay | Admitting: Obstetrics and Gynecology

## 2023-02-17 DIAGNOSIS — N946 Dysmenorrhea, unspecified: Secondary | ICD-10-CM

## 2023-05-12 NOTE — H&P (Unsigned)
 Tonya Kramer, LUVIANO MEDICAL RECORD NO: [id number] ACCOUNT NO: [id number] DATE OF BIRTH: [birth year] PHYSICIAN: Verdel Gitelman. Steve El, MD  History and Physical   DATE OF ADMISSION: 05/20/2023  Upcoming surgery on 04/24 at [place].  CHIEF COMPLAINT:  Menorrhagia, dysmenorrhea.  HISTORY OF PRESENT ILLNESS:  The patient is a 33 year old G2, P2.  Currently using condoms for contraception.  She has a more chronic problem related to menorrhagia and dysmenorrhea that has not responded to hormonal suppression with Lo Loestrin.  Last  year, she did have a CT when she had an ED visit and there was some concern about an ovarian cyst, that was returned as normal.  She presents at this time for definitive LAVH and bilateral salpingectomy.  This procedure including specific risks regarding  bleeding, infection, transfusion, and the possible need to complete surgery by open technique along with expected recovery time was reviewed with her.  Other risks regarding adjacent organ injury likewise discussed.  PAST MEDICAL HISTORY:  ALLERGIES:  None.  CURRENT MEDICATIONS:  None.  OBSTETRICAL HISTORY:  She is a G2, P2, two vaginal deliveries.  FAMILY HISTORY:  Paternal grandfather with heart disease and hypertension.  Her father also had hypertension.  Both grandfathers have had prostate cancer and there is a family history of asthma also.  SOCIAL HISTORY:  She is married.  She is a former smoker.  Occasional alcohol use.  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  PAST SURGICAL HISTORY:  She has had a prior labial reduction.  She has had hysteroscopy at one point, tonsillectomy, two vaginal deliveries.  REVIEW OF SYSTEMS:  In the past, she has had ADHD and anxiety, but not currently on any medications.  PHYSICAL EXAMINATION: VITAL SIGNS:  Temperature 98.2.  Blood pressure 120/70 mmHg. HEENT:  Unremarkable. NECK:  Supple without masses. LUNGS:  Clear. CARDIOVASCULAR:  Regular rate and rhythm without  murmurs, rubs, or gallops. BREASTS:  Without masses, tenderness or nipple discharge. ABDOMEN:  Soft, flat, nontender. PELVIC:  Vulva and vagina normal.  Cervix normal.  Uterus normal size.  Adnexa negative. EXTREMITIES:  Unremarkable. NEUROLOGIC:  Unremarkable.  IMPRESSION: Dysmenorrhea, menorrhagia, unresponsive to conservative treatment.  PLAN:  LAVH and bilateral salpingectomy.  Procedure and risks discussed as above.   PUS D: 05/06/2023 9:51:59 am T: 05/06/2023 4:30:00 pm  JOB: [id number]/ [id number]

## 2023-05-13 NOTE — Progress Notes (Signed)
 Surgical Instructions   Your procedure is scheduled on Thursday, April 24th. Report to [facility] Main Entrance A at 5:30 A.M., then check in with the Admitting office. Any questions or running late day of surgery: call [phone]  Questions prior to your surgery date: call [phone], Monday-Friday, 8am-4pm. If you experience any cold or flu symptoms such as cough, fever, chills, shortness of breath, etc. between now and your scheduled surgery, please notify us  at the above number.     Remember:  Do not eat after midnight the night before your surgery   You may drink clear liquids until 4:30 the morning of your surgery.   Clear liquids allowed are: Water, Non-Citrus Juices (without pulp), Carbonated Beverages, Clear Tea (no milk, honey, etc.), Black Coffee Only (NO MILK, CREAM OR POWDERED CREAMER of any kind), and Gatorade.  May take these medicines IF NEEDED: cetirizine (ZYRTEC)    One week prior to surgery, STOP taking any Aspirin (unless otherwise instructed by your surgeon) Aleve, Naproxen, Ibuprofen , Motrin , Advil , Goody's, BC's, all herbal medications, fish oil, and non-prescription vitamins.                     Do NOT Smoke (Tobacco/Vaping) for 24 hours prior to your procedure.  If you use a CPAP at night, you may bring your mask/headgear for your overnight stay.   You will be asked to remove any contacts, glasses, piercing's, hearing aid's, dentures/partials prior to surgery. Please bring cases for these items if needed.    Patients discharged the day of surgery will not be allowed to drive home, and someone needs to stay with them for 24 hours.  SURGICAL WAITING ROOM VISITATION Patients may have no more than 2 support people in the waiting area - these visitors may rotate.   Pre-op nurse will coordinate an appropriate time for 1 ADULT support person, who may not rotate, to accompany patient in pre-op.  Children under the age of 74 must have an adult with them who  is not the patient and must remain in the main waiting area with an adult.  If the patient needs to stay at the hospital during part of their recovery, the visitor guidelines for inpatient rooms apply.  Please refer to the [facility] website for the visitor guidelines for any additional information.   If you received a COVID test during your pre-op visit  it is requested that you wear a mask when out in public, stay away from anyone that may not be feeling well and notify your surgeon if you develop symptoms. If you have been in contact with anyone that has tested positive in the last 10 days please notify you surgeon.      Pre-operative CHG Bathing Instructions   You can play a key role in reducing the risk of infection after surgery. Your skin needs to be as free of germs as possible. You can reduce the number of germs on your skin by washing with CHG (chlorhexidine  gluconate) soap before surgery. CHG is an antiseptic soap that kills germs and continues to kill germs even after washing.   DO NOT use if you have an allergy to chlorhexidine /CHG or antibacterial soaps. If your skin becomes reddened or irritated, stop using the CHG and notify one of our RNs at [phone].              TAKE A SHOWER THE NIGHT BEFORE SURGERY AND THE DAY OF SURGERY    Please keep in mind  the following:  DO NOT shave, including legs and underarms, 48 hours prior to surgery.   You may shave your face before/day of surgery.  Place clean sheets on your bed the night before surgery Use a clean washcloth (not used since being washed) for each shower. DO NOT sleep with pet's night before surgery.  CHG Shower Instructions:  Wash your face and private area with normal soap. If you choose to wash your hair, wash first with your normal shampoo.  After you use shampoo/soap, rinse your hair and body thoroughly to remove shampoo/soap residue.  Turn the water OFF and apply half the bottle of CHG soap to a CLEAN  washcloth.  Apply CHG soap ONLY FROM YOUR NECK DOWN TO YOUR TOES (washing for 3-5 minutes)  DO NOT use CHG soap on face, private areas, open wounds, or sores.  Pay special attention to the area where your surgery is being performed.  If you are having back surgery, having someone wash your back for you may be helpful. Wait 2 minutes after CHG soap is applied, then you may rinse off the CHG soap.  Pat dry with a clean towel  Put on clean pajamas    Additional instructions for the day of surgery: DO NOT APPLY any lotions, deodorants, cologne, or perfumes.   Do not wear jewelry or makeup Do not wear nail polish, gel polish, artificial nails, or any other type of covering on natural nails (fingers and toes) Do not bring valuables to the hospital. [facility] is not responsible for valuables/personal belongings. Put on clean/comfortable clothes.  Please brush your teeth.  Ask your nurse before applying any prescription medications to the skin.

## 2023-05-14 ENCOUNTER — Encounter (HOSPITAL_COMMUNITY)
Admission: RE | Admit: 2023-05-14 | Discharge: 2023-05-14 | Disposition: A | Discharge status: Home / Self Care | Source: Ambulatory Visit | Attending: Obstetrics and Gynecology | Admitting: Obstetrics and Gynecology

## 2023-05-14 ENCOUNTER — Other Ambulatory Visit: Payer: Self-pay

## 2023-05-14 ENCOUNTER — Encounter (HOSPITAL_COMMUNITY): Payer: Self-pay

## 2023-05-14 VITALS — BP 105/81 | HR 101 | Temp 98.1°F | Resp 16 | Ht 63.0 in | Wt 99.0 lb

## 2023-05-14 DIAGNOSIS — Z01818 Encounter for other preprocedural examination: Secondary | ICD-10-CM | POA: Diagnosis present

## 2023-05-14 DIAGNOSIS — N946 Dysmenorrhea, unspecified: Secondary | ICD-10-CM | POA: Insufficient documentation

## 2023-05-14 HISTORY — DX: Anxiety disorder, unspecified: F41.9

## 2023-05-14 LAB — TYPE AND SCREEN
ABO/RH(D): A POS
Antibody Screen: NEGATIVE

## 2023-05-14 LAB — CBC
HCT: 41 % (ref 36.0–46.0)
Hemoglobin: 14.4 g/dL (ref 12.0–15.0)
MCH: 31.2 pg (ref 26.0–34.0)
MCHC: 35.1 g/dL (ref 30.0–36.0)
MCV: 88.7 fL (ref 80.0–100.0)
Platelets: 292 10*3/uL (ref 150–400)
RBC: 4.62 MIL/uL (ref 3.87–5.11)
RDW: 12 % (ref 11.5–15.5)
WBC: 6.3 10*3/uL (ref 4.0–10.5)
nRBC: 0 % (ref 0.0–0.2)

## 2023-05-14 LAB — SURGICAL PCR SCREEN
MRSA, PCR: NEGATIVE
Staphylococcus aureus: NEGATIVE

## 2023-05-14 NOTE — Progress Notes (Signed)
 PCP - Aldona Pizza Gennett Dinsbeer, FNP  Cardiologist -   PPM/ICD - denies Device Orders - n/a Rep Notified - n/a  Chest x-ray - 03-13-15 EKG - 03-13-15 Stress Test - denies ECHO - denies Cardiac Cath - denies  Sleep Study - denies CPAP - n/a  Dm -denies  Blood Thinner Instructions:denies Aspirin Instructions:denies  ERAS Protcol - clear liquids until 4:30 am   COVID TEST- n/a   Anesthesia review: no  Patient denies shortness of breath, fever, cough and chest pain at PAT appointment   All instructions explained to the patient, with a verbal understanding of the material. Patient agrees to go over the instructions while at home for a better understanding. Patient also instructed to self quarantine after being tested for COVID-19. The opportunity to ask questions was provided.

## 2023-05-17 NOTE — H&P (Signed)
 NAMETARESSA, Tonya Kramer MEDICAL RECORD NO: [id number] ACCOUNT NO: [id number] DATE OF BIRTH: 03-08-1990 FACILITY: [facility] LOCATION: [facility] PHYSICIAN: Verdel Gitelman. Steve El, MD  History and Physical   DATE OF ADMISSION: 05/20/2023  Date of surgery upcoming 05/20/2023 at [facility].  CHIEF COMPLAINT:  Menorrhagia, dysmenorrhea, unresponsive to conservative management.  HISTORY OF PRESENT ILLNESS:  The patient is a 33 year old married, G2, P2.  She has had two vaginal deliveries.  She is continuing to have problems with menorrhagia and dysmenorrhea, treated conservatively with OCPs without improvement.  Her CT that was  done last year after an ED visit when there was concern about an ovarian cyst was normal.  She is sure she does not want to be pregnant again.  She has not gotten relief from Motrin  or OCT.  She presents now for LAVH and bilateral salpingectomy.  This  procedure including specific risks regarding bleeding, infection, adjacent organ injury, wound infection, phlebitis, possible need to complete the surgery by an open technique along with her expected recovery time was discussed.  The rationale for  bilateral salpingectomy was discussed also.  PAST MEDICAL HISTORY: ALLERGIES:  None.  CURRENT MEDICATIONS:  None.  REVIEW OF SYSTEMS:  Significant for history of ADHD, not currently on medications and also GAD not currently on any other medications.  OBSTETRICAL HISTORY:  She is a G2, P2, delivered both vaginally.  FAMILY HISTORY:  Significant for heart disease, hypertension, both grandfathers have had prostate cancer, grandfather with diabetes, family history of asthma in father, mother, two sisters, and grandfather who has had COPD.  Also positive for arthritis,  migraine, and her mother has dyslipidemia.  SOCIAL HISTORY:  She is married.  She is a former smoker.  Occasional alcohol use.  She is a stay-at-home mother.  PAST SURGICAL HISTORY:  She had a labioplasty in the past,  tonsillectomy, two vaginal deliveries, prior hysteroscopy.  PHYSICAL EXAMINATION: VITAL SIGNS:  Temperature 98.2.  Blood pressure is 120/70. HEENT:  Unremarkable. NECK:  Supple without masses. LUNGS:  Clear. CARDIOVASCULAR:  Regular rate and rhythm without murmurs, rubs or gallops noted. BREASTS:  Negative. ABDOMEN:  Soft, flat, and nontender. PELVIC:  Vulva, vagina, cervix normal.  Uterus normal size.  Mobile.  Adnexa negative. EXTREMITIES:  Unremarkable. NEUROLOGIC:  Unremarkable.  IMPRESSION:  Menorrhagia, dysmenorrhea.  PLAN:  LAVH, bilateral salpingectomy.  Procedure and risks discussed as above.   PUS D: 05/12/2023 10:06:50 am T: 05/13/2023 2:05:00 am  JOB: [id number]/ [id number]

## 2023-05-20 ENCOUNTER — Other Ambulatory Visit: Payer: Self-pay

## 2023-05-20 ENCOUNTER — Encounter (HOSPITAL_COMMUNITY): Payer: Self-pay | Admitting: Obstetrics and Gynecology

## 2023-05-20 ENCOUNTER — Observation Stay (HOSPITAL_COMMUNITY)
Admission: RE | Admit: 2023-05-20 | Discharge: 2023-05-22 | Disposition: A | Discharge status: Home / Self Care | Payer: BLUE CROSS/BLUE SHIELD | Attending: Obstetrics and Gynecology | Admitting: Obstetrics and Gynecology

## 2023-05-20 ENCOUNTER — Encounter (HOSPITAL_COMMUNITY)
Admission: RE | Disposition: A | Discharge status: Home / Self Care | Payer: Self-pay | Source: Home / Self Care | Attending: Obstetrics and Gynecology

## 2023-05-20 ENCOUNTER — Ambulatory Visit (HOSPITAL_COMMUNITY): Payer: Self-pay | Admitting: Physician Assistant

## 2023-05-20 ENCOUNTER — Ambulatory Visit (HOSPITAL_BASED_OUTPATIENT_CLINIC_OR_DEPARTMENT_OTHER): Payer: Self-pay

## 2023-05-20 DIAGNOSIS — N946 Dysmenorrhea, unspecified: Principal | ICD-10-CM | POA: Diagnosis present

## 2023-05-20 DIAGNOSIS — Z5331 Laparoscopic surgical procedure converted to open procedure: Secondary | ICD-10-CM | POA: Diagnosis not present

## 2023-05-20 DIAGNOSIS — N80122 Deep endometriosis of left ovary: Secondary | ICD-10-CM

## 2023-05-20 DIAGNOSIS — Z87891 Personal history of nicotine dependence: Secondary | ICD-10-CM | POA: Diagnosis not present

## 2023-05-20 DIAGNOSIS — R102 Pelvic and perineal pain: Secondary | ICD-10-CM

## 2023-05-20 HISTORY — PX: LAPAROSCOPIC VAGINAL HYSTERECTOMY WITH SALPINGECTOMY: SHX6680

## 2023-05-20 HISTORY — PX: HYSTERECTOMY ABDOMINAL WITH SALPINGECTOMY: SHX6725

## 2023-05-20 LAB — POCT PREGNANCY, URINE: Preg Test, Ur: NEGATIVE

## 2023-05-20 SURGERY — HYSTERECTOMY, VAGINAL, LAPAROSCOPY-ASSISTED, WITH SALPINGECTOMY
Anesthesia: General | Site: Abdomen | Laterality: Bilateral

## 2023-05-20 MED ORDER — PHENYLEPHRINE 80 MCG/ML (10ML) SYRINGE FOR IV PUSH (FOR BLOOD PRESSURE SUPPORT)
PREFILLED_SYRINGE | INTRAVENOUS | Status: AC
  Filled 2023-05-20: qty 10

## 2023-05-20 MED ORDER — HYDROMORPHONE HCL 2 MG PO TABS
2.0000 mg | ORAL_TABLET | ORAL | Status: DC | PRN
  Administered 2023-05-20 – 2023-05-22 (×7): 2 mg via ORAL
  Filled 2023-05-20 (×9): qty 1

## 2023-05-20 MED ORDER — BUPIVACAINE HCL (PF) 0.25 % IJ SOLN
INTRAMUSCULAR | Status: AC
Start: 2023-05-20 — End: ?
  Filled 2023-05-20: qty 30

## 2023-05-20 MED ORDER — SUGAMMADEX SODIUM 200 MG/2ML IV SOLN
INTRAVENOUS | Status: DC | PRN
  Administered 2023-05-20 (×2): 100 mg via INTRAVENOUS

## 2023-05-20 MED ORDER — OXYCODONE HCL 5 MG PO TABS
5.0000 mg | ORAL_TABLET | ORAL | Status: DC | PRN
  Administered 2023-05-20: 10 mg via ORAL
  Filled 2023-05-20: qty 2

## 2023-05-20 MED ORDER — SODIUM CHLORIDE (PF) 0.9 % IJ SOLN
INTRAMUSCULAR | Status: AC
  Filled 2023-05-20: qty 10

## 2023-05-20 MED ORDER — ONDANSETRON HCL 4 MG PO TABS
4.0000 mg | ORAL_TABLET | Freq: Four times a day (QID) | ORAL | Status: DC | PRN
  Administered 2023-05-21: 4 mg via ORAL
  Filled 2023-05-20: qty 1

## 2023-05-20 MED ORDER — LIDOCAINE 2% (20 MG/ML) 5 ML SYRINGE
INTRAMUSCULAR | Status: AC
  Filled 2023-05-20: qty 5

## 2023-05-20 MED ORDER — PROPOFOL 10 MG/ML IV BOLUS
INTRAVENOUS | Status: AC
  Filled 2023-05-20: qty 20

## 2023-05-20 MED ORDER — MENTHOL 3 MG MT LOZG: 1.0000 | LOZENGE | OROMUCOSAL | Status: DC | PRN

## 2023-05-20 MED ORDER — ONDANSETRON HCL 4 MG/2ML IJ SOLN: 4.0000 mg | Freq: Four times a day (QID) | INTRAMUSCULAR | Status: DC | PRN

## 2023-05-20 MED ORDER — HYDROMORPHONE HCL 1 MG/ML IJ SOLN
INTRAMUSCULAR | Status: AC
  Filled 2023-05-20: qty 1

## 2023-05-20 MED ORDER — PROPOFOL 10 MG/ML IV BOLUS
INTRAVENOUS | Status: DC | PRN
  Administered 2023-05-20: 130 mg via INTRAVENOUS

## 2023-05-20 MED ORDER — LACTATED RINGERS IV SOLN
INTRAVENOUS | Status: DC | PRN
Start: 2023-05-20 — End: 2023-05-20

## 2023-05-20 MED ORDER — ACETAMINOPHEN 500 MG PO TABS
1000.0000 mg | ORAL_TABLET | Freq: Once | ORAL | Status: AC
  Administered 2023-05-20: 1000 mg via ORAL
  Filled 2023-05-20: qty 2

## 2023-05-20 MED ORDER — DEXAMETHASONE SODIUM PHOSPHATE 10 MG/ML IJ SOLN
INTRAMUSCULAR | Status: AC
  Filled 2023-05-20: qty 1

## 2023-05-20 MED ORDER — SODIUM CHLORIDE 0.9 % IR SOLN
Status: DC | PRN
  Administered 2023-05-20: 1000 mL
  Administered 2023-05-20: 2000 mL

## 2023-05-20 MED ORDER — FENTANYL CITRATE (PF) 250 MCG/5ML IJ SOLN: INTRAMUSCULAR | Status: DC | PRN

## 2023-05-20 MED ORDER — FENTANYL CITRATE (PF) 250 MCG/5ML IJ SOLN
INTRAMUSCULAR | Status: DC | PRN
  Administered 2023-05-20: 100 ug via INTRAVENOUS

## 2023-05-20 MED ORDER — MIDAZOLAM HCL 2 MG/2ML IJ SOLN
INTRAMUSCULAR | Status: AC
  Filled 2023-05-20: qty 2

## 2023-05-20 MED ORDER — BUPIVACAINE HCL (PF) 0.25 % IJ SOLN
INTRAMUSCULAR | Status: DC | PRN
  Administered 2023-05-20: 5 mL

## 2023-05-20 MED ORDER — SODIUM CHLORIDE 0.9 % IV SOLN: 2.0000 g | INTRAVENOUS | Status: DC

## 2023-05-20 MED ORDER — HYDROMORPHONE HCL 1 MG/ML IJ SOLN
0.2500 mg | INTRAMUSCULAR | Status: DC | PRN
  Administered 2023-05-20 (×2): 0.25 mg via INTRAVENOUS

## 2023-05-20 MED ORDER — MEPERIDINE HCL 25 MG/ML IJ SOLN: 6.2500 mg | INTRAMUSCULAR | Status: DC | PRN

## 2023-05-20 MED ORDER — EPHEDRINE 5 MG/ML INJ
INTRAVENOUS | Status: AC
  Filled 2023-05-20: qty 5

## 2023-05-20 MED ORDER — IBUPROFEN 600 MG PO TABS
600.0000 mg | ORAL_TABLET | Freq: Four times a day (QID) | ORAL | Status: DC
  Administered 2023-05-21 – 2023-05-22 (×2): 600 mg via ORAL
  Filled 2023-05-20 (×2): qty 1

## 2023-05-20 MED ORDER — POVIDONE-IODINE 10 % EX SWAB: 2.0000 | Freq: Once | CUTANEOUS | Status: DC

## 2023-05-20 MED ORDER — EPHEDRINE SULFATE-NACL 50-0.9 MG/10ML-% IV SOSY
PREFILLED_SYRINGE | INTRAVENOUS | Status: DC | PRN
  Administered 2023-05-20 (×2): 10 mg via INTRAVENOUS

## 2023-05-20 MED ORDER — PHENYLEPHRINE HCL (PRESSORS) 10 MG/ML IV SOLN
INTRAVENOUS | Status: DC | PRN
Start: 2023-05-20 — End: 2023-05-20
  Administered 2023-05-20: 160 ug via INTRAVENOUS

## 2023-05-20 MED ORDER — DEXAMETHASONE SODIUM PHOSPHATE 10 MG/ML IJ SOLN
INTRAMUSCULAR | Status: DC | PRN
  Administered 2023-05-20: 10 mg via INTRAVENOUS

## 2023-05-20 MED ORDER — ONDANSETRON HCL 4 MG/2ML IJ SOLN: 4.0000 mg | Freq: Once | INTRAMUSCULAR | Status: DC | PRN

## 2023-05-20 MED ORDER — KETOROLAC TROMETHAMINE 30 MG/ML IJ SOLN
30.0000 mg | Freq: Four times a day (QID) | INTRAMUSCULAR | Status: AC
  Administered 2023-05-20 – 2023-05-21 (×3): 30 mg via INTRAVENOUS
  Filled 2023-05-20 (×3): qty 1

## 2023-05-20 MED ORDER — MIDAZOLAM HCL 2 MG/2ML IJ SOLN
INTRAMUSCULAR | Status: DC | PRN
  Administered 2023-05-20: 2 mg via INTRAVENOUS

## 2023-05-20 MED ORDER — KETAMINE HCL 10 MG/ML IJ SOLN
INTRAMUSCULAR | Status: DC | PRN
Start: 2023-05-20 — End: 2023-05-20
  Administered 2023-05-20: 5 mg via INTRAVENOUS
  Administered 2023-05-20: 15 mg via INTRAVENOUS

## 2023-05-20 MED ORDER — KETAMINE HCL 50 MG/5ML IJ SOSY
PREFILLED_SYRINGE | INTRAMUSCULAR | Status: AC
  Filled 2023-05-20: qty 5

## 2023-05-20 MED ORDER — AMISULPRIDE (ANTIEMETIC) 5 MG/2ML IV SOLN
10.0000 mg | Freq: Once | INTRAVENOUS | Status: AC | PRN
  Administered 2023-05-20: 10 mg via INTRAVENOUS

## 2023-05-20 MED ORDER — KETOROLAC TROMETHAMINE 30 MG/ML IJ SOLN: 30.0000 mg | Freq: Once | INTRAMUSCULAR | Status: DC | PRN

## 2023-05-20 MED ORDER — SODIUM CHLORIDE 0.9 % IV SOLN
2.0000 g | INTRAVENOUS | Status: AC
  Administered 2023-05-20: 2 g via INTRAVENOUS
  Filled 2023-05-20: qty 2

## 2023-05-20 MED ORDER — LIDOCAINE 2% (20 MG/ML) 5 ML SYRINGE
INTRAMUSCULAR | Status: DC | PRN
  Administered 2023-05-20: 40 mg via INTRAVENOUS

## 2023-05-20 MED ORDER — ROCURONIUM BROMIDE 10 MG/ML (PF) SYRINGE
PREFILLED_SYRINGE | INTRAVENOUS | Status: AC
  Filled 2023-05-20: qty 10

## 2023-05-20 MED ORDER — FENTANYL CITRATE (PF) 250 MCG/5ML IJ SOLN
INTRAMUSCULAR | Status: AC
  Filled 2023-05-20: qty 5

## 2023-05-20 MED ORDER — PROPOFOL 1000 MG/100ML IV EMUL
INTRAVENOUS | Status: AC
  Filled 2023-05-20: qty 100

## 2023-05-20 MED ORDER — PHENYLEPHRINE HCL-NACL 20-0.9 MG/250ML-% IV SOLN
INTRAVENOUS | Status: DC | PRN
  Administered 2023-05-20: 40 ug/min via INTRAVENOUS

## 2023-05-20 MED ORDER — HYDROMORPHONE HCL 1 MG/ML IJ SOLN
INTRAMUSCULAR | Status: DC | PRN
  Administered 2023-05-20: .5 mg via INTRAVENOUS

## 2023-05-20 MED ORDER — PHENYLEPHRINE 80 MCG/ML (10ML) SYRINGE FOR IV PUSH (FOR BLOOD PRESSURE SUPPORT)
PREFILLED_SYRINGE | INTRAVENOUS | Status: DC | PRN
  Administered 2023-05-20: 80 ug via INTRAVENOUS
  Administered 2023-05-20 (×2): 160 ug via INTRAVENOUS
  Administered 2023-05-20: 80 ug via INTRAVENOUS

## 2023-05-20 MED ORDER — ONDANSETRON HCL 4 MG/2ML IJ SOLN
INTRAMUSCULAR | Status: DC | PRN
Start: 2023-05-20 — End: 2023-05-20
  Administered 2023-05-20: 4 mg via INTRAVENOUS

## 2023-05-20 MED ORDER — HYDROMORPHONE HCL 1 MG/ML IJ SOLN
INTRAMUSCULAR | Status: AC
  Filled 2023-05-20: qty 0.5

## 2023-05-20 MED ORDER — OXYCODONE HCL 5 MG PO TABS: 5.0000 mg | ORAL_TABLET | Freq: Once | ORAL | Status: DC | PRN

## 2023-05-20 MED ORDER — DEXTROSE IN LACTATED RINGERS 5 % IV SOLN: INTRAVENOUS | Status: AC

## 2023-05-20 MED ORDER — BUPIVACAINE HCL (PF) 0.25 % IJ SOLN
INTRAMUSCULAR | Status: DC | PRN
  Administered 2023-05-20: 30 mL via PERINEURAL

## 2023-05-20 MED ORDER — POVIDONE-IODINE 10 % EX SWAB
2.0000 | Freq: Once | CUTANEOUS | Status: AC
  Administered 2023-05-20: 2 via TOPICAL

## 2023-05-20 MED ORDER — ROCURONIUM BROMIDE 10 MG/ML (PF) SYRINGE
PREFILLED_SYRINGE | INTRAVENOUS | Status: DC | PRN
  Administered 2023-05-20: 20 mg via INTRAVENOUS
  Administered 2023-05-20: 50 mg via INTRAVENOUS

## 2023-05-20 MED ORDER — ONDANSETRON HCL 4 MG/2ML IJ SOLN
INTRAMUSCULAR | Status: AC
  Filled 2023-05-20: qty 2

## 2023-05-20 MED ORDER — CHLORHEXIDINE GLUCONATE 0.12 % MT SOLN
15.0000 mL | Freq: Once | OROMUCOSAL | Status: AC
  Administered 2023-05-20: 15 mL via OROMUCOSAL
  Filled 2023-05-20: qty 15

## 2023-05-20 MED ORDER — GABAPENTIN 100 MG PO CAPS
200.0000 mg | ORAL_CAPSULE | Freq: Two times a day (BID) | ORAL | Status: DC
  Administered 2023-05-20 – 2023-05-21 (×4): 200 mg via ORAL
  Filled 2023-05-20 (×4): qty 2

## 2023-05-20 MED ORDER — AMISULPRIDE (ANTIEMETIC) 5 MG/2ML IV SOLN
INTRAVENOUS | Status: AC
Start: 2023-05-20 — End: 2023-05-20
  Filled 2023-05-20: qty 4

## 2023-05-20 MED ORDER — SCOPOLAMINE 1 MG/3DAYS TD PT72
1.0000 | MEDICATED_PATCH | TRANSDERMAL | Status: DC
  Administered 2023-05-20: 1.5 mg via TRANSDERMAL
  Filled 2023-05-20: qty 1

## 2023-05-20 MED ORDER — GABAPENTIN 300 MG PO CAPS
300.0000 mg | ORAL_CAPSULE | ORAL | Status: AC
  Administered 2023-05-20: 300 mg via ORAL
  Filled 2023-05-20: qty 1

## 2023-05-20 MED ORDER — SUCCINYLCHOLINE CHLORIDE 200 MG/10ML IV SOSY
PREFILLED_SYRINGE | INTRAVENOUS | Status: AC
  Filled 2023-05-20: qty 10

## 2023-05-20 MED ORDER — ORAL CARE MOUTH RINSE: 15.0000 mL | Freq: Once | OROMUCOSAL | Status: AC

## 2023-05-20 MED ORDER — OXYCODONE HCL 5 MG/5ML PO SOLN: 5.0000 mg | Freq: Once | ORAL | Status: DC | PRN

## 2023-05-20 MED ORDER — SODIUM CHLORIDE 0.9 % IV SOLN: INTRAVENOUS | Status: DC | PRN

## 2023-05-20 MED ORDER — BUPIVACAINE LIPOSOME 1.3 % IJ SUSP
INTRAMUSCULAR | Status: DC | PRN
  Administered 2023-05-20: 20 mL via PERINEURAL

## 2023-05-20 MED ORDER — LACTATED RINGERS IV SOLN: INTRAVENOUS | Status: DC

## 2023-05-20 MED ORDER — BUPIVACAINE LIPOSOME 1.3 % IJ SUSP
INTRAMUSCULAR | Status: AC
  Filled 2023-05-20: qty 20

## 2023-05-20 MED ORDER — KETOROLAC TROMETHAMINE 30 MG/ML IJ SOLN
INTRAMUSCULAR | Status: DC | PRN
  Administered 2023-05-20: 15 mg via INTRAVENOUS

## 2023-05-20 SURGICAL SUPPLY — 57 items
CABLE HIGH FREQUENCY MONO STRZ (ELECTRODE) IMPLANT
CATH ROBINSON RED A/P 16FR (CATHETERS) ×3 IMPLANT
CELLS DAT CNTRL 66122 CELL SVR (MISCELLANEOUS) ×2 IMPLANT
COVER BACK TABLE 60X90IN (DRAPES) ×3 IMPLANT
COVER MAYO STAND STRL (DRAPES) ×6 IMPLANT
COVER SURGICAL LIGHT HANDLE (MISCELLANEOUS) ×3 IMPLANT
DERMABOND ADVANCED .7 DNX12 (GAUZE/BANDAGES/DRESSINGS) ×3 IMPLANT
DRAPE SURG IRRIG POUCH 19X23 (DRAPES) ×3 IMPLANT
DRAPE WARM FLUID 44X44 (DRAPES) IMPLANT
DRSG DERMACEA NONADH 3X8 (GAUZE/BANDAGES/DRESSINGS) IMPLANT
DRSG OPSITE 6X11 MED (GAUZE/BANDAGES/DRESSINGS) IMPLANT
DRSG OPSITE POSTOP 3X4 (GAUZE/BANDAGES/DRESSINGS) IMPLANT
DURAPREP 26ML APPLICATOR (WOUND CARE) ×3 IMPLANT
ELECTRODE REM PT RTRN 9FT ADLT (ELECTROSURGICAL) ×3 IMPLANT
GAUZE 4X4 16PLY ~~LOC~~+RFID DBL (SPONGE) ×9 IMPLANT
GAUZE SPONGE 4X4 12PLY STRL (GAUZE/BANDAGES/DRESSINGS) IMPLANT
GLOVE BIO SURGEON STRL SZ7 (GLOVE) ×6 IMPLANT
GLOVE ECLIPSE 6.5 STRL STRAW (GLOVE) ×3 IMPLANT
HEMOSTAT ARISTA ABSORB 3G PWDR (HEMOSTASIS) IMPLANT
HOLDER FOLEY CATH W/STRAP (MISCELLANEOUS) ×3 IMPLANT
IRRIGATION SUCT STRKRFLW 2 WTP (MISCELLANEOUS) IMPLANT
KIT PINK PAD W/HEAD ARE REST (MISCELLANEOUS) ×2 IMPLANT
KIT PINK PAD W/HEAD ARM REST (MISCELLANEOUS) ×3 IMPLANT
KIT TURNOVER CYSTO (KITS) ×3 IMPLANT
LIGASURE IMPACT 36 18CM CVD LR (INSTRUMENTS) IMPLANT
NDL INSUFFLATION 14GA 120MM (NEEDLE) ×3 IMPLANT
NEEDLE INSUFFLATION 14GA 120MM (NEEDLE) ×2 IMPLANT
NS IRRIG 1000ML POUR BTL (IV SOLUTION) ×3 IMPLANT
PACK LAVH (CUSTOM PROCEDURE TRAY) ×3 IMPLANT
PACK ROBOTIC GOWN (GOWN DISPOSABLE) ×3 IMPLANT
PENCIL SMOKE EVACUATOR (MISCELLANEOUS) IMPLANT
PROTECTOR NERVE ULNAR (MISCELLANEOUS) ×6 IMPLANT
RETRACTOR WND ALEXIS 18 MED (MISCELLANEOUS) IMPLANT
SEALER TISSUE G2 CVD JAW 45CM (ENDOMECHANICALS) IMPLANT
SET TUBE SMOKE EVAC HIGH FLOW (TUBING) ×3 IMPLANT
SLEEVE SCD COMPRESS KNEE MED (STOCKING) ×3 IMPLANT
SPIKE FLUID TRANSFER (MISCELLANEOUS) IMPLANT
SPONGE T-LAP 18X18 ~~LOC~~+RFID (SPONGE) IMPLANT
SPONGE T-LAP 4X18 ~~LOC~~+RFID (SPONGE) ×3 IMPLANT
SUT MNCRL AB 4-0 PS2 18 (SUTURE) IMPLANT
SUT MON AB 2-0 CT1 36 (SUTURE) IMPLANT
SUT PDS AB 0 CT 36 (SUTURE) IMPLANT
SUT VIC AB 0 CT1 18XCR BRD 8 (SUTURE) IMPLANT
SUT VIC AB 0 CT1 18XCR BRD8 (SUTURE) ×6 IMPLANT
SUT VIC AB 0 CT1 36 (SUTURE) ×3 IMPLANT
SUT VIC AB 2-0 CT1 (SUTURE) IMPLANT
SUT VIC AB 4-0 PS2 18 (SUTURE) ×3 IMPLANT
SUT VICRYL 0 TIES 12 18 (SUTURE) ×3 IMPLANT
SYR BULB IRRIG 60ML STRL (SYRINGE) IMPLANT
TOWEL OR 17X24 6PK STRL BLUE (TOWEL DISPOSABLE) ×3 IMPLANT
TRAY FOLEY W/BAG SLVR 14FR LF (SET/KITS/TRAYS/PACK) ×3 IMPLANT
TROCAR Z-THREAD BLADED 11X100M (TROCAR) ×3 IMPLANT
TROCAR Z-THREAD BLADED 5X100MM (TROCAR) ×3 IMPLANT
TROCAR Z-THREAD FIOS 5X100MM (TROCAR) IMPLANT
TUBE CONNECTING 20X1/4 (TUBING) IMPLANT
WARMER LAPAROSCOPE (MISCELLANEOUS) ×3 IMPLANT
YANKAUER SUCT BULB TIP NO VENT (SUCTIONS) IMPLANT

## 2023-05-20 NOTE — Transfer of Care (Signed)
 Immediate Anesthesia Transfer of Care Note  Patient: Tonya Kramer  Procedure(s) Performed: HYSTERECTOMY, VAGINAL, LAPAROSCOPY-ASSISTED, WITH SALPINGECTOMY CONVERTED TO OPEN (Bilateral: Abdomen) HYSTERECTOMY, TOTAL, ABDOMINAL, WITH LEFT SALPINGO-OOPHORECTOMY, RIGHT SALPINGECTOMY (Abdomen)  Patient Location: PACU  Anesthesia Type:General and Regional  Level of Consciousness: awake and drowsy  Airway & Oxygen Therapy: Patient Spontanous Breathing  Post-op Assessment: Report given to RN and Post -op Vital signs reviewed and stable  Post vital signs: Reviewed and stable  Last Vitals:  Vitals Value Taken Time  BP 110/71 05/20/23 1015  Temp 36.5 C 05/20/23 1010  Pulse 60 05/20/23 1017  Resp 13 05/20/23 1017  SpO2 100 % 05/20/23 1017  Vitals shown include unfiled device data.  Last Pain:  Vitals:   05/20/23 0620  TempSrc:   PainSc: 0-No pain         Complications: No notable events documented.

## 2023-05-20 NOTE — Progress Notes (Signed)
 Patient requested foley catheter be removed. Order received by MD to discontinue. Foley catheter removed, tolerated well. Bed in lowest position, call light within reach.

## 2023-05-20 NOTE — Addendum Note (Signed)
 Addendum  created 05/20/23 1149 by Jacquelyne Matte, DO   Child order released for a procedure order, Clinical Note Signed, Intraprocedure Blocks edited, Intraprocedure Meds edited, SmartForm saved

## 2023-05-20 NOTE — Progress Notes (Signed)
 The patient was re-examined with no change in status. Reviewed procedure, discussed poss of TAH if significant scarring/endo

## 2023-05-20 NOTE — Anesthesia Postprocedure Evaluation (Signed)
 Anesthesia Post Note  Patient: RITISHA DEITRICK  Procedure(s) Performed: HYSTERECTOMY, VAGINAL, LAPAROSCOPY-ASSISTED, WITH SALPINGECTOMY CONVERTED TO OPEN (Bilateral: Abdomen) HYSTERECTOMY, TOTAL, ABDOMINAL, WITH LEFT SALPINGO-OOPHORECTOMY, RIGHT SALPINGECTOMY (Abdomen)     Patient location during evaluation: PACU Anesthesia Type: General Level of consciousness: awake and alert, oriented and patient cooperative Pain management: pain level controlled Vital Signs Assessment: post-procedure vital signs reviewed and stable Respiratory status: spontaneous breathing, nonlabored ventilation and respiratory function stable Cardiovascular status: blood pressure returned to baseline and stable Postop Assessment: no apparent nausea or vomiting Anesthetic complications: no   No notable events documented.  Last Vitals:  Vitals:   05/20/23 1100 05/20/23 1115  BP: 110/76 118/69  Pulse: (!) 48 (!) 51  Resp: 12 10  Temp:  36.5 C  SpO2: 100% 100%    Last Pain:  Vitals:   05/20/23 1030  TempSrc:   PainSc: 10-Worst pain ever                 Jacquelyne Matte

## 2023-05-20 NOTE — Plan of Care (Signed)
  Problem: Nutrition: Goal: Adequate nutrition will be maintained Outcome: Progressing   Problem: Skin Integrity: Goal: Risk for impaired skin integrity will decrease Outcome: Progressing   Problem: Activity: Goal: Risk for activity intolerance will decrease Outcome: Not Progressing   Problem: Coping: Goal: Level of anxiety will decrease Outcome: Not Progressing   Problem: Pain Managment: Goal: General experience of comfort will improve and/or be controlled Outcome: Not Progressing

## 2023-05-20 NOTE — Progress Notes (Signed)
 IV in left hand has been removed. Patient stated it was causing her pain. Site assessed with no concerns noted, IV removed, and tolerated well.

## 2023-05-20 NOTE — Progress Notes (Signed)
 Patient arrived to [facility] in hospital bed with PACU. Patient complained of a 8/10 pain located in the abdomen. Pain medication administered, tolerated well and was effective. Bed in lowest position, call light within reach.

## 2023-05-20 NOTE — Anesthesia Procedure Notes (Signed)
 Procedure Name: Intubation Date/Time: 05/20/2023 7:56 AM  Performed by: Candance Certain, CRNAPre-anesthesia Checklist: Patient identified Patient Re-evaluated:Patient Re-evaluated prior to induction Oxygen Delivery Method: Circle System Utilized Preoxygenation: Pre-oxygenation with 100% oxygen Induction Type: IV induction Ventilation: Mask ventilation without difficulty Laryngoscope Size: Mac and 3 Grade View: Grade II Tube type: Oral Tube size: 7.0 mm Number of attempts: 1 Airway Equipment and Method: Stylet and Oral airway Placement Confirmation: ETT inserted through vocal cords under direct vision, positive ETCO2 and breath sounds checked- equal and bilateral Secured at: 21 cm Tube secured with: Tape Dental Injury: Teeth and Oropharynx as per pre-operative assessment

## 2023-05-20 NOTE — Op Note (Unsigned)
 Tonya Kramer, Tonya Kramer MEDICAL RECORD NO: [id number] ACCOUNT NO: [id number] DATE OF BIRTH: [birth year] FACILITY: [facility] LOCATION: [facility] PHYSICIAN: Verdel Gitelman. Steve El, MD  Operative Report   DATE OF PROCEDURE: 05/20/2023  Surgery was done at [facility] main.  PREOPERATIVE DIAGNOSES:  Dysmenorrhea and pelvic pain.  POSTOPERATIVE DIAGNOSES:  Dysmenorrhea and pelvic pain, endometrioma, left, and endometriosis.  PROCEDURES PERFORMED: 1.  Diagnostic laparoscopy followed by laparotomy with total abdominal hysterectomy. 2.  Left salpingo-oophorectomy. 3.  Right salpingectomy.  SURGEON:  Dr. Steve El.  ASSISTANT:  Chen.  ANESTHESIA:  General.  COMPLICATIONS:  None.  DRAINS:  Foley catheter.  BLOOD LOSS:  150 mL.  PROCEDURE AND FINDINGS:  The patient was taken to the operating room after an adequate level of general anesthesia was obtained with the patient's legs in stirrups.  The abdomen was prepped and draped.  The vagina prepped separately for laparoscopy and a  Foley catheter was positioned.  Appropriate timeouts were taken at that point.  Hulka's tenaculum was positioned.  Attention was directed to the abdomen.  A small subumbilical incision was made after Marcaine  local.  The Veress needle was introduced  without difficulty; its intraabdominal position was verified by pressure and water testing.  After a 2-liter pneumoperitoneum had been created, laparoscopic trocar and sleevefirst were then introduced without difficulty.  The patient was then placed in slight  Trendelenburg and 2 fingerbreadths above the symphysis in the midline, a 5-mm trocar was inserted under direct visualization.  A blunt probe was positioned and the pelvic findings as follows.  The uterus was normal in size.  I could not get the right  ovary to elevate due to apparent endometriosis.  The left ovary was enlarged approximately 4 cm and was also adherent to the left pelvic sidewall.  Decision was made at this point to  proceed with open procedure.  Instruments were changed out.  The legs were placed flatter; the umbilical incision closed separately.  Then, a Pfannenstiel incision was made.  2 fingerbreadths above the symphysis, carried down to the fascia, which was incised and extended  transversely.  Rectus muscles were divided in the midline.  Peritoneum entered superiorly without incident and extended in a vertical fashion.  Alexis retractor was positioned; bowel was packed superiorly out of the field.  The patient was placed in  slight Trendelenburg.  Pelvic findings revealed that the right ovary was adherent to the right pelvic sidewall.  This could be dissected free with blunt dissection.  That ovary appeared relatively normal.  The left side, however, was enlarged to 4 cm, was also adherent to the  left pelvic sidewall with careful blunt dissection, was able to be elevated.  Decision made to proceed with left salpingo-oophorectomy and right salpingectomy with conservation of her right ovary along with hysterectomy.  Starting on the left, round ligament clamped, divided, and suture ligated.  The peritoneum was carried around to the midline; posterior leaf of the broad ligament was perforated with the surgeon's finger; the course of the ureter on that side was well  below.  The left infundibulopelvic ligament was then isolated, clamped, divided, first free tie, followed by suture ligature 0 Vicryl.  The exact same repeated on the opposite side except that right ovary was conserved.  Right salpingectomy was carried out  separately.  The bladder was then advanced inferiorly rather below the cervical vaginal junction.  In a sequential manner, the uterine vasculature pedicle, cardinal ligament, cervical vaginal pedicles, and uterosacral ligament pedicles were clamped,  divided,  and suture ligated with 0 Vicryl on each side staying close to the uterus.  The specimen was then removed.  The vaginal cuff was closed with  interrupted 2-0 Vicryl sutures.  The pelvis was irrigated with saline and the operative site was  inspected and noted to be hemostatic.  Her appendix was inspected and noted to be normal.  Arista was placed at the vaginal cuff.  There were some raw areas due to the endometriosis, but this was hemostatic.  Prior to closure, sponge, needle, instrument  counts were reported as correct x 2.  Peritoneum was closed with a 2-0 Vicryl suture.  Rectus muscle was closed in the midline with the same.  Fascia was closed from laterally to midline on either side with a 0 PDS suture.  Subcutaneous tissue was very  thin, was not closed separately, closed with a 4-0 Monocryl subcuticular closure along with Dermabond.  Anesthesia was going to perform a transverse block at the end of the procedure for postoperative analgesia.  Clear urine noted at the end of the case.   She tolerated this well.  Went to the recovery room after the nerve block.    MUK D: 05/20/2023 10:36:10 am T: 05/20/2023 11:07:00 am  JOB: [id number]/ [id number]

## 2023-05-20 NOTE — Anesthesia Preprocedure Evaluation (Addendum)
 Anesthesia Evaluation  Patient identified by MRN, date of birth, ID band Patient awake    Reviewed: Allergy & Precautions, NPO status , Patient's Chart, lab work & pertinent test results  Airway Mallampati: I  TM Distance: >3 FB Neck ROM: Full    Dental  (+) Teeth Intact, Dental Advisory Given   Pulmonary former smoker   Pulmonary exam normal breath sounds clear to auscultation       Cardiovascular negative cardio ROS Normal cardiovascular exam Rhythm:Regular Rate:Normal     Neuro/Psych Seizures - (a few episodes two years ago), Well Controlled,  PSYCHIATRIC DISORDERS Anxiety Depression       GI/Hepatic negative GI ROS,,,(+)       marijuana use  Endo/Other  negative endocrine ROS    Renal/GU negative Renal ROS  negative genitourinary   Musculoskeletal negative musculoskeletal ROS (+)    Abdominal   Peds  Hematology negative hematology ROS (+) Hb 14.4, plt 292   Anesthesia Other Findings   Reproductive/Obstetrics Dysmenorrhea, menorrhagia                              Anesthesia Physical Anesthesia Plan  ASA: 2  Anesthesia Plan: General   Post-op Pain Management: Tylenol  PO (pre-op)*, Toradol  IV (intra-op)*, Ketamine  IV*, Precedex and Dilaudid  IV   Induction: Intravenous  PONV Risk Score and Plan: 4 or greater and Ondansetron , Dexamethasone , Midazolam , Scopolamine  patch - Pre-op and Treatment may vary due to age or medical condition  Airway Management Planned: Oral ETT  Additional Equipment: None  Intra-op Plan:   Post-operative Plan: Extubation in OR  Informed Consent: I have reviewed the patients History and Physical, chart, labs and discussed the procedure including the risks, benefits and alternatives for the proposed anesthesia with the patient or authorized representative who has indicated his/her understanding and acceptance.     Dental advisory given  Plan  Discussed with: CRNA  Anesthesia Plan Comments:        Anesthesia Quick Evaluation

## 2023-05-20 NOTE — Anesthesia Procedure Notes (Signed)
 Anesthesia Regional Block: TAP block   Pre-Anesthetic Checklist: , timeout performed,  Correct Patient, Correct Site, Correct Laterality,  Correct Procedure, Correct Position, site marked,  Risks and benefits discussed,  Surgical consent,  Pre-op evaluation,  At surgeon's request and post-op pain management  Laterality: Left and Right  Prep: Maximum Sterile Barrier Precautions used, chloraprep       Needles:  Injection technique: Single-shot  Needle Type: Echogenic Stimulator Needle     Needle Length: 9cm  Needle Gauge: 22     Additional Needles:   Procedures:,,,, ultrasound used (permanent image in chart),,    Narrative:  Start time: 05/20/2023 9:55 AM End time: 05/20/2023 10:00 AM Injection made incrementally with aspirations every 5 mL.  Performed by: Personally  Anesthesiologist: Jacquelyne Matte, DO  Additional Notes: Monitors applied. No increased pain on injection. No increased resistance to injection. Injection made in 5cc increments. Good needle visualization. Patient tolerated procedure well.

## 2023-05-21 ENCOUNTER — Encounter (HOSPITAL_COMMUNITY): Payer: Self-pay | Admitting: Obstetrics and Gynecology

## 2023-05-21 DIAGNOSIS — N946 Dysmenorrhea, unspecified: Secondary | ICD-10-CM | POA: Diagnosis not present

## 2023-05-21 LAB — CBC
HCT: 31.8 % — ABNORMAL LOW (ref 36.0–46.0)
Hemoglobin: 11 g/dL — ABNORMAL LOW (ref 12.0–15.0)
MCH: 31.7 pg (ref 26.0–34.0)
MCHC: 34.6 g/dL (ref 30.0–36.0)
MCV: 91.6 fL (ref 80.0–100.0)
Platelets: 263 10*3/uL (ref 150–400)
RBC: 3.47 MIL/uL — ABNORMAL LOW (ref 3.87–5.11)
RDW: 12.3 % (ref 11.5–15.5)
WBC: 15 10*3/uL — ABNORMAL HIGH (ref 4.0–10.5)
nRBC: 0 % (ref 0.0–0.2)

## 2023-05-21 LAB — SURGICAL PATHOLOGY

## 2023-05-21 MED ORDER — IBUPROFEN 600 MG PO TABS: 600.0000 mg | ORAL_TABLET | Freq: Four times a day (QID) | ORAL | 0 refills | Status: AC

## 2023-05-21 MED ORDER — HYDROMORPHONE HCL 2 MG PO TABS: 2.0000 mg | ORAL_TABLET | ORAL | 0 refills | Status: AC | PRN

## 2023-05-21 NOTE — Plan of Care (Signed)

## 2023-05-21 NOTE — Progress Notes (Signed)
 Discharge delayed; Provider wants patient to stay on unit to ambulate before discharge to confirm effects of medications have worn off.

## 2023-05-21 NOTE — Plan of Care (Signed)
  Problem: Education: Goal: Knowledge of General Education information will improve Description: Including pain rating scale, medication(s)/side effects and non-pharmacologic comfort measures Outcome: Progressing   Problem: Clinical Measurements: Goal: Will remain free from infection Outcome: Progressing   Problem: Activity: Goal: Risk for activity intolerance will decrease Outcome: Progressing   Problem: Coping: Goal: Level of anxiety will decrease Outcome: Progressing   Problem: Elimination: Goal: Will not experience complications related to bowel motility Outcome: Progressing   Problem: Pain Managment: Goal: General experience of comfort will improve and/or be controlled Outcome: Progressing

## 2023-05-21 NOTE — Progress Notes (Signed)
 Pts BP was soft 93/49 this morning, , pt requested taking out her IV last night, stated it bothered her. Pt requested pain meds this morning, notified doctor [place] of BP, instructed this writer to give the Dilaudid , given. Past on report to day nurse.

## 2023-05-21 NOTE — Discharge Summary (Signed)
 Physician Discharge Summary  Patient ID: Tonya Kramer MRN: [id number] DOB/AGE: 04-10-1990 33 y.o.  Admit date: 05/20/2023 Discharge date: 05/21/2023  Admission Diagnoses:Dysmenorrhea/pelvic pain  Discharge Diagnoses: same + endometriosis Principal Problem:   Dysmenorrhea   Discharged Condition: good  Hospital Course: adm for LAVH>>.findings at surgery>>endo affcting L ovary>>>TAH LSO, R salpingectomy>>.CONSERVED R OVARY  Consults: None  Significant Diagnostic Studies: labs:  Results for orders placed or performed during the hospital encounter of 05/20/23 (from the past 48 hours)  Pregnancy, urine POC     Status: None   Collection Time: 05/20/23  6:46 AM  Result Value Ref Range   Preg Test, Ur NEGATIVE NEGATIVE    Comment:        THE SENSITIVITY OF THIS METHODOLOGY IS >24 mIU/mL   CBC     Status: Abnormal   Collection Time: 05/21/23  3:38 AM  Result Value Ref Range   WBC 15.0 (H) 4.0 - 10.5 K/uL   RBC 3.47 (L) 3.87 - 5.11 MIL/uL   Hemoglobin 11.0 (L) 12.0 - 15.0 g/dL   HCT 40.9 (L) 81.1 - 91.4 %   MCV 91.6 80.0 - 100.0 fL   MCH 31.7 26.0 - 34.0 pg   MCHC 34.6 30.0 - 36.0 g/dL   RDW 78.2 95.6 - 21.3 %   Platelets 263 150 - 400 K/uL   nRBC 0.0 0.0 - 0.2 %    Comment: Performed at [facility], [address] [place], [place] [address]     Treatments: surgery: DL>>TAH LSO, R salpingectomy  Discharge Exam: Blood pressure 96/61, pulse 65, temperature 98.8 F (37.1 C), temperature source Oral, resp. rate 16, height 5\' 3"  (1.6 m), weight 44.5 kg, last menstrual period 04/29/2023, SpO2 99%. General appearance: alert and cooperative Abd + BS, dressing dry, abd soft, nontender  Disposition: Discharge disposition: 01-Home or Self Care        Allergies as of 05/21/2023   No Known Allergies      Medication List     STOP taking these medications    HYDROcodone -acetaminophen  5-325 MG tablet Commonly known as: NORCO/VICODIN   ondansetron  8 MG  tablet Commonly known as: Zofran        TAKE these medications    cetirizine 10 MG tablet Commonly known as: ZYRTEC Take 10 mg by mouth daily as needed for allergies.   HYDROmorphone  2 MG tablet Commonly known as: DILAUDID  Take 1 tablet (2 mg total) by mouth every 3 (three) hours as needed for severe pain (pain score 7-10).   ibuprofen  600 MG tablet Commonly known as: ADVIL  Take 1 tablet (600 mg total) by mouth every 6 (six) hours.        Follow-up Information     Woodrow Hazy, MD Follow up in 1 week(s).   Specialty: Obstetrics and Gynecology Why: office will call for 1 week appt Contact information: [address] [place] [address] [phone]                 Signed: Piedad Brewer 05/21/2023, 8:06 AM

## 2023-05-22 DIAGNOSIS — N946 Dysmenorrhea, unspecified: Secondary | ICD-10-CM | POA: Diagnosis not present

## 2023-05-22 NOTE — Progress Notes (Signed)
 POD2  Ready for D/C this am, tol PO and ambulating  BP (!) 95/59 (BP Location: Left Arm)   Pulse 70   Temp 98.5 F (36.9 C) (Oral)   Resp 18   Ht 5\' 3"  (1.6 m)   Wt 44.5 kg   LMP 04/29/2023 (Exact Date)   SpO2 99%   BMI 17.36 kg/m   Abd soft + BS, bandage removed>>>Inc C/D  Will D/C today

## 2023-05-22 NOTE — Plan of Care (Signed)
  Problem: Education: Goal: Knowledge of General Education information will improve Description: Including pain rating scale, medication(s)/side effects and non-pharmacologic comfort measures Outcome: Completed/Met   Problem: Health Behavior/Discharge Planning: Goal: Ability to manage health-related needs will improve Outcome: Completed/Met   Problem: Clinical Measurements: Goal: Ability to maintain clinical measurements within normal limits will improve Outcome: Completed/Met Goal: Will remain free from infection Outcome: Completed/Met Goal: Diagnostic test results will improve Outcome: Completed/Met Goal: Respiratory complications will improve Outcome: Completed/Met Goal: Cardiovascular complication will be avoided Outcome: Completed/Met   Problem: Activity: Goal: Risk for activity intolerance will decrease Outcome: Completed/Met   Problem: Nutrition: Goal: Adequate nutrition will be maintained Outcome: Completed/Met   Problem: Coping: Goal: Level of anxiety will decrease Outcome: Completed/Met   Problem: Elimination: Goal: Will not experience complications related to bowel motility Outcome: Completed/Met Goal: Will not experience complications related to urinary retention Outcome: Completed/Met   Problem: Pain Managment: Goal: General experience of comfort will improve and/or be controlled Outcome: Completed/Met   Problem: Safety: Goal: Ability to remain free from injury will improve Outcome: Completed/Met   Problem: Skin Integrity: Goal: Risk for impaired skin integrity will decrease Outcome: Completed/Met   Problem: Education: Goal: Knowledge of the prescribed therapeutic regimen will improve Outcome: Completed/Met Goal: Understanding of sexual limitations or changes related to disease process or condition will improve Outcome: Completed/Met Goal: Individualized Educational Video(s) Outcome: Completed/Met   Problem: Self-Concept: Goal: Communication of  feelings regarding changes in body function or appearance will improve Outcome: Completed/Met   Problem: Skin Integrity: Goal: Demonstration of wound healing without infection will improve Outcome: Completed/Met
